# Patient Record
Sex: Female | Born: 1975
Health system: Southern US, Community
[De-identification: ages and names within clinical notes are randomized; demographics above are authoritative.]

## PROBLEM LIST (undated history)

## (undated) DIAGNOSIS — E079 Disorder of thyroid, unspecified: Secondary | ICD-10-CM

## (undated) HISTORY — PX: KNEE ARTHROPLASTY: SHX992

## (undated) HISTORY — PX: EAR TUBE REMOVAL: SHX1486

---

## 2002-01-20 ENCOUNTER — Other Ambulatory Visit: Admission: RE | Admit: 2002-01-20 | Discharge: 2002-01-20 | Payer: Self-pay | Admitting: Obstetrics & Gynecology

## 2004-01-24 ENCOUNTER — Ambulatory Visit (HOSPITAL_COMMUNITY): Admission: RE | Admit: 2004-01-24 | Discharge: 2004-01-24 | Payer: Self-pay | Admitting: Family Medicine

## 2004-10-06 ENCOUNTER — Inpatient Hospital Stay (HOSPITAL_COMMUNITY): Admission: RE | Admit: 2004-10-06 | Discharge: 2004-10-09 | Payer: Self-pay | Admitting: Obstetrics & Gynecology

## 2005-10-04 ENCOUNTER — Encounter: Admission: RE | Admit: 2005-10-04 | Discharge: 2005-10-04 | Payer: Self-pay | Admitting: Occupational Medicine

## 2009-01-17 ENCOUNTER — Emergency Department (HOSPITAL_COMMUNITY): Admission: EM | Admit: 2009-01-17 | Discharge: 2009-01-17 | Payer: Self-pay | Admitting: Emergency Medicine

## 2009-10-26 ENCOUNTER — Other Ambulatory Visit: Admission: RE | Admit: 2009-10-26 | Discharge: 2009-10-26 | Payer: Self-pay | Admitting: Obstetrics and Gynecology

## 2009-11-15 ENCOUNTER — Ambulatory Visit (HOSPITAL_COMMUNITY): Admission: RE | Admit: 2009-11-15 | Discharge: 2009-11-15 | Payer: Self-pay | Admitting: Obstetrics & Gynecology

## 2010-03-13 ENCOUNTER — Ambulatory Visit (HOSPITAL_COMMUNITY)
Admission: RE | Admit: 2010-03-13 | Discharge: 2010-03-13 | Payer: Self-pay | Source: Home / Self Care | Admitting: Obstetrics & Gynecology

## 2010-05-11 LAB — CBC
HCT: 26.7 % — ABNORMAL LOW (ref 36.0–46.0)
Hemoglobin: 8.4 g/dL — ABNORMAL LOW (ref 12.0–15.0)
MCH: 23 pg — ABNORMAL LOW (ref 26.0–34.0)
MCHC: 31.5 g/dL (ref 30.0–36.0)
MCV: 73 fL — ABNORMAL LOW (ref 78.0–100.0)
Platelets: 172 10*3/uL (ref 150–400)
RBC: 3.66 MIL/uL — ABNORMAL LOW (ref 3.87–5.11)
RDW: 17.9 % — ABNORMAL HIGH (ref 11.5–15.5)
WBC: 11 10*3/uL — ABNORMAL HIGH (ref 4.0–10.5)

## 2010-05-11 LAB — TSH: TSH: 4.051 u[IU]/mL (ref 0.350–4.500)

## 2010-05-11 LAB — T3, FREE: T3, Free: 2.5 pg/mL (ref 2.3–4.2)

## 2010-05-11 LAB — RPR: RPR Ser Ql: NONREACTIVE

## 2010-05-11 LAB — T4, FREE: Free T4: 0.76 ng/dL — ABNORMAL LOW (ref 0.80–1.80)

## 2010-05-11 LAB — SURGICAL PCR SCREEN
MRSA, PCR: NEGATIVE
Staphylococcus aureus: NEGATIVE

## 2010-05-18 ENCOUNTER — Inpatient Hospital Stay (HOSPITAL_COMMUNITY)
Admission: RE | Admit: 2010-05-18 | Discharge: 2010-05-20 | DRG: 766 | Disposition: A | Discharge status: Home / Self Care | Payer: 59 | Source: Ambulatory Visit | Attending: Obstetrics & Gynecology | Admitting: Obstetrics & Gynecology

## 2010-05-18 DIAGNOSIS — O9902 Anemia complicating childbirth: Secondary | ICD-10-CM | POA: Diagnosis present

## 2010-05-18 DIAGNOSIS — O34219 Maternal care for unspecified type scar from previous cesarean delivery: Principal | ICD-10-CM | POA: Diagnosis present

## 2010-05-18 DIAGNOSIS — E039 Hypothyroidism, unspecified: Secondary | ICD-10-CM | POA: Diagnosis present

## 2010-05-18 DIAGNOSIS — O09529 Supervision of elderly multigravida, unspecified trimester: Secondary | ICD-10-CM | POA: Diagnosis present

## 2010-05-18 DIAGNOSIS — D509 Iron deficiency anemia, unspecified: Secondary | ICD-10-CM | POA: Diagnosis present

## 2010-05-18 DIAGNOSIS — E079 Disorder of thyroid, unspecified: Secondary | ICD-10-CM | POA: Diagnosis present

## 2010-05-18 LAB — ABO/RH: ABO/RH(D): O POS

## 2010-05-19 LAB — CBC
HCT: 22.6 % — ABNORMAL LOW (ref 36.0–46.0)
Hemoglobin: 6.9 g/dL — CL (ref 12.0–15.0)
MCH: 22.9 pg — ABNORMAL LOW (ref 26.0–34.0)
MCHC: 30.5 g/dL (ref 30.0–36.0)
MCV: 75.1 fL — ABNORMAL LOW (ref 78.0–100.0)
Platelets: 145 10*3/uL — ABNORMAL LOW (ref 150–400)
RBC: 3.01 MIL/uL — ABNORMAL LOW (ref 3.87–5.11)
RDW: 17.6 % — ABNORMAL HIGH (ref 11.5–15.5)
WBC: 10.6 10*3/uL — ABNORMAL HIGH (ref 4.0–10.5)

## 2010-05-20 LAB — CBC
HCT: 22.7 % — ABNORMAL LOW (ref 36.0–46.0)
Hemoglobin: 7 g/dL — ABNORMAL LOW (ref 12.0–15.0)
MCH: 22.7 pg — ABNORMAL LOW (ref 26.0–34.0)
MCHC: 30 g/dL (ref 30.0–36.0)
MCV: 75.9 fL — ABNORMAL LOW (ref 78.0–100.0)
Platelets: 160 10*3/uL (ref 150–400)
RBC: 2.99 MIL/uL — ABNORMAL LOW (ref 3.87–5.11)
RDW: 17.9 % — ABNORMAL HIGH (ref 11.5–15.5)
WBC: 9.8 10*3/uL (ref 4.0–10.5)

## 2010-05-21 LAB — TYPE AND SCREEN
ABO/RH(D): O POS
Antibody Screen: NEGATIVE
Unit division: 0
Unit division: 0

## 2010-05-21 LAB — FERRITIN: Ferritin: 12 ng/mL (ref 10–291)

## 2010-05-24 LAB — HEMOGLOBINOPATHY EVALUATION
Hemoglobin Other: 0 % (ref 0.0–0.0)
Hgb A2 Quant: 2.2 % (ref 2.2–3.2)
Hgb A: 97.8 % (ref 96.8–97.8)
Hgb F Quant: 0 % (ref 0.0–2.0)
Hgb S Quant: 0 % (ref 0.0–0.0)

## 2010-05-27 ENCOUNTER — Inpatient Hospital Stay (HOSPITAL_COMMUNITY): Admission: AD | Admit: 2010-05-27 | Payer: Self-pay | Source: Ambulatory Visit | Admitting: Obstetrics & Gynecology

## 2010-06-28 NOTE — Discharge Summary (Signed)
Debra Hess, Debra Hess                ACCOUNT NO.:  [id number]  MEDICAL RECORD NO.:  [id number]           PATIENT TYPE:  I  LOCATION:  9148                          [facility]ACILITY[facility]  [facility]  PHYSICIAN:  Debra Hess, M.D.DATE OF BIRTH:  1975-12-25  DATE OF ADMISSION:  05/18/2010 DATE OF DISCHARGE:  05/20/2010                              DISCHARGE SUMMARY   ADMITTING DIAGNOSES:  A 39 weeks' gestation, previous cesarean section, desires repeat, and chronic anemia.  DISCHARGE DIAGNOSES:  Postoperative day #2, status post repeat cesarean section, and chronic anemia.  HISTORY:  The patient is a 35 year old gravida 3, para 2, at 19 weeks' gestation with an [facility] of May 25, 2010.  Prenatal care has been at [facility] since 15 weeks' gestation with Dr. Seymour Bars as primary. Prenatal labs include the patient is O+, rubella positive, HIV negative, RPR negative, hepatitis B negative, and a GTT within normal limits. Prenatal course was complicated by history of previous C-section x2, advanced maternal age, anemia, and hypothyroidism.  Medical surgery history is consistent for anemia, hypothyroidism, and cesarean section x2.  ALLERGIES:  AMOXICILLIN and MORPHINE.  CURRENT MEDICATIONS:  Prenatal vitamin 1 tablet daily, Niferex 150 mg p.o. daily, Tylenol 650 mg q.4 h. p.r.n. discomfort, Colace 100 mg twice daily for prevention of constipation, and Ambien 10 mg p.o. at bedtime p.r.n. for sleep.  PRESENTATION:  The patient presents for scheduled cesarean section on the day of admission.  Admission CBC:  White blood cell count of 11, hemoglobin 8.4, hematocrit 26.7, and platelet count of 172.  The patient undergoes cesarean section by Dr. Seymour Bars on May 18, 2010, with delivery of a female, 9 pounds 15 ounces with Apgar scores of 9 and 9. Newborn was transferred to the regular nursery.  POSTPARTUM CARE:  Postoperative care is uneventful.  Postoperative CBC: White blood cell count 10.6,  hemoglobin 6.9, hematocrit 22.6, and platelet count of 145 on postoperative day #1.  On postoperative day #2, repeat CBC reveals a hemoglobin stable at 7.0, hematocrit 22.7, and a platelet count of 160.  Vital signs are within normal limits.  The patient remains afebrile during the hospitalization.  Physical exam is within normal limits.  Wound edges are well approximated with staples. No erythema, no ecchymosis, or drainage.  The patient is discharged home in stable condition on postoperative day #2.  DISCHARGE INSTRUCTIONS:  Postpartum instructions per [facility] booklet.  Activity restrictions x2 weeks postoperatively.  The patient is on a regular diet.  The patient to return to [facility] on May 24, 2010, for removal of staples.  Medications at the time of discharge include prenatal vitamins 1 tablet p.o. daily, Niferex 150 mg p.o. daily x3 months, Colace 1 to 2 caps daily as needed for constipation prevention p.r.n., ibuprofen 800 mg every 8 hours p.r.n. discomfort, Percocet 1-2 tablets every 4 hours p.r.n. for pain.  Routine followup in 6 weeks postpartum.     Debra Hess, C.N.M.   ______________________________ Debra Hess, M.D.    TB/MEDQ  D:  05/20/2010  T:  05/21/2010  Job:  [id number]  Electronically Signed by Debra Hess C.N.M. on  06/01/2010 11:25:55 AM Electronically Signed by Debra Hess M.D. on 06/28/2010 06:22:20 PM

## 2010-06-28 NOTE — Op Note (Signed)
Debra Hess, Debra Hess                ACCOUNT NO.:  [id number]  MEDICAL RECORD NO.:  [id number]           PATIENT TYPE:  I  LOCATION:  9148                          [facility]  PHYSICIAN:  Genia Del, M.D.DATE OF BIRTH:  [birth year]  DATE OF PROCEDURE:  05/18/2010 DATE OF DISCHARGE:                              OPERATIVE REPORT   [facility] for a C- section.  PREOPERATIVE DIAGNOSES:  A 39+ weeks' gestation, two previous cesarean sections.  POSTOPERATIVE DIAGNOSES:  A 39+ weeks' gestation, two previous cesarean sections, adhesions between omentum, bladder, and uterus.  PROCEDURE:  Repeat low-transverse C-section and lysis of adhesions.  PROCEDURE:  Under spinal anesthesia in 15-degree left decubitus position, the patient is prepped with SurgiPrep on the abdomen and suprapubic areas and with Betadine on the vulvar and vaginal areas.  The bladder is catheterized and a Foley is left in place.  We draped the patient as usual.  We verify the level of anesthesia, which is adequate. We do a local anesthesia with Marcaine 0.25 plain 10 mL at the level of the previous Pfannenstiel.  We make a Pfannenstiel with a scalpel at the previous incision.  We opened the adipose tissue with the scalpel and then with the electrocautery in cutting mode.  We control hemostasis with the electrocautery in coag mode.  We opened the aponeurosis transversely with the electrocautery and then with Mayo scissors.  We then separate the recti muscles from the aponeurosis superiorly and inferiorly.  We note that on the right side, no muscle fibroids are present.  We then open the parietal peritoneum with [facility] scissors. There we have adhesions between the omentum and the abdominal wall as well as with the uterus and the bladder.  We are able to release the omentum sufficiently to access the lower uterine segment safely.  We lower the bladder downward  and put the bladder retractor in place.  The lower uterine segment is pretty thin.  We make a low transverse incision with the scalpel over the lower uterine segment.  We extend on each side with dressing scissors.  The amniotic fluid is clear.  The fetus is in cephalic presentation.  Birth of the baby boy at 8:08 a.m.  The cord is clamped and cut.  The baby was suctioned and given to the neonatal team. Apgars are 9 and 9.  The weight is 9 pounds 15 ounces.  We take the cord blood.  We remove the placenta spontaneously and do an uterine revision. We use fenestrated clamp on the hysterotomy to control bleeding and help with closure.  It is not possible to exteriorize the uterus.  Both tubes and both ovaries are normal to inspection.  We close the hysterotomy with a first full plain locked running suture of Vicryl 0.  We close then with a second plane in a mattress stitch of Vicryl 0.  Hemostasis is adequate at all levels.  We irrigate and suction the abdominopelvic cavity.  We then close the aponeurosis with two half running sutures of Vicryl 0.  We then do  a running suture of plain to reapproximate the adipose tissue.  We then reapproximated the skin with staples and apply a compressive dressing.  Note that the patient received Flagyl 500 mg IV before the beginning of the intervention.  The count of instruments and sponges was complete.  The estimated blood loss was 600 mL.  No complications occurred and the patient was brought to recovery room in good stable status.     Genia Del, M.D.     ML/MEDQ  D:  05/18/2010  T:  05/19/2010  Job:  [id number]  Electronically Signed by Genia Del M.D. on 06/28/2010 06:22:17 PM

## 2010-07-20 LAB — DIFFERENTIAL
Basophils Absolute: 0.1 10*3/uL (ref 0.0–0.1)
Basophils Relative: 1 % (ref 0–1)
Eosinophils Absolute: 0.1 10*3/uL (ref 0.0–0.7)
Eosinophils Relative: 1 % (ref 0–5)
Lymphocytes Relative: 27 % (ref 12–46)
Lymphs Abs: 2.3 10*3/uL (ref 0.7–4.0)
Monocytes Absolute: 0.7 10*3/uL (ref 0.1–1.0)
Monocytes Relative: 8 % (ref 3–12)
Neutro Abs: 5.4 10*3/uL (ref 1.7–7.7)
Neutrophils Relative %: 63 % (ref 43–77)

## 2010-07-20 LAB — COMPREHENSIVE METABOLIC PANEL
ALT: 13 U/L (ref 0–35)
AST: 17 U/L (ref 0–37)
Albumin: 4.1 g/dL (ref 3.5–5.2)
Alkaline Phosphatase: 61 U/L (ref 39–117)
BUN: 12 mg/dL (ref 6–23)
CO2: 25 mEq/L (ref 19–32)
Calcium: 9 mg/dL (ref 8.4–10.5)
Chloride: 106 mEq/L (ref 96–112)
Creatinine, Ser: 0.83 mg/dL (ref 0.4–1.2)
GFR calc Af Amer: >60 mL/min (ref >60)
GFR calc non Af Amer: >60 mL/min (ref >60)
Glucose, Bld: 104 mg/dL — ABNORMAL HIGH (ref 70–99)
Potassium: 3.8 mEq/L (ref 3.5–5.1)
Sodium: 137 mEq/L (ref 135–145)
Total Bilirubin: 0.5 mg/dL (ref 0.3–1.2)
Total Protein: 7 g/dL (ref 6.0–8.3)

## 2010-07-20 LAB — POCT CARDIAC MARKERS
CKMB, poc: <1 ng/mL — ABNORMAL LOW (ref 1.0–8.0)
Myoglobin, poc: 37.6 ng/mL (ref 12–200)
Troponin i, poc: <0.05 ng/mL (ref 0.00–0.09)

## 2010-07-20 LAB — D-DIMER, QUANTITATIVE: D-Dimer, Quant: 0.26 ug/mL-FEU (ref 0.00–0.48)

## 2010-07-20 LAB — CBC
HCT: 33.2 % — ABNORMAL LOW (ref 36.0–46.0)
Hemoglobin: 11.4 g/dL — ABNORMAL LOW (ref 12.0–15.0)
MCHC: 34.3 g/dL (ref 30.0–36.0)
MCV: 83.7 fL (ref 78.0–100.0)
Platelets: 214 10*3/uL (ref 150–400)
RBC: 3.96 MIL/uL (ref 3.87–5.11)
RDW: 13.2 % (ref 11.5–15.5)
WBC: 8.6 10*3/uL (ref 4.0–10.5)

## 2010-09-01 NOTE — Discharge Summary (Signed)
Debra Hess, Debra Hess                ACCOUNT NO.:  [id number]   MEDICAL RECORD NO.:  [id number]          PATIENT TYPE:  INP   LOCATION:  9120                          [facility]   PHYSICIAN:  Genia Del, M.D.DATE OF BIRTH:  1975/06/30   DATE OF ADMISSION:  10/06/2004  DATE OF DISCHARGE:  10/09/2004                                 DISCHARGE SUMMARY   ADMISSION DIAGNOSIS:  1.  Thirty-nine weeks.  2.  History of previous dissection.   DISCHARGE DIAGNOSES:  1.  Thirty-nine weeks.  2.  History of previous dissection.   INTERVENTION:  Repeat low transverse cesarean section.   HOSPITAL COURSE:  The patient decided to have a repeat low transverse  cesarean section.  She had a baby girl.  Apgars were 8 and 9.  The surgery  went without any complications.  Her postoperative evaluation was normal.  She was discharged in stable status on postoperative day #3.  Postoperative  advice given.  She was prescribed Tylox p.r.n.  Her hemoglobin postoperative  was 9.6.  Chromagen  Forte daily was also prescribed.  She will follow up  [facility] in four weeks.      Genia Del, M.D.  Electronically Signed     ML/MEDQ  D:  11/22/2004  T:  11/23/2004  Job:  [id number]

## 2010-09-01 NOTE — Op Note (Signed)
Debra Hess, Debra Hess                ACCOUNT NO.:  [id number]   MEDICAL RECORD NO.:  [id number]          PATIENT TYPE:  INP   LOCATION:  9120                          [facility]   PHYSICIAN:  Genia Del, M.D.DATE OF BIRTH:  1975/06/14   DATE OF PROCEDURE:  10/06/2004  DATE OF DISCHARGE:                                 OPERATIVE REPORT   PREOPERATIVE DIAGNOSIS:  1. Thirty-nine-plus weeks gestation.  2. History of cesarean section.   POSTOPERATIVE DIAGNOSES:  1. Thirty-nine-plus weeks gestation.  2. History of cesarean section.   OPERATION/PROCEDURE:  Repeat low transverse cesarean section.   SURGEON:  Genia Del, M.D.   ANESTHESIOLOGIST:  Burnett Corrente, M.D.   DESCRIPTION OF PROCEDURE:  Under spinal anesthesia, the patient is in 15-  degree left decubitus position.  She was prepped with Techni-Care on the  abdomen, suprapubic, vulva, and vaginal areas and draped as usual.  The  level of anesthesia is verified and adequate.  We used to scalpel to resect  the scar from her previous cesarean section.  We then opened the aponeurosis  transversely with the electrocautery and the Mayo scissors.  We opened the  parietal peritoneum longitudinally with Metzenbaum scissors.  We then opened  the visceroperitoneum over the lower uterine segment transversely with  Metzenbaum scissors.  The bladder is reclined downward.  We make a low  transverse hysterotomy with the scalpel.  Extension on each side is done  with dressing scissors.  The amniotic fluid is clear.  The fetus is in  cephalic position. Birth of a baby girl at 11.  The cord is clamped and  cut.  The baby is suctioned and given to neonatal team.  Apgars are 8 an day  #9.  The cord blood was taken and evacuated spontaneously.  It is complete  with three vessels.  Revision of the intrauterine cavity is done.  The  Pitocin is started in the IV fluid.  A dose of Flagyl IV is given.  The  uterus contracts well.  We  close the hysterotomy in one locked running  suture of 0 Vicryl.  We make a second layer with a mattress stitch of 0  Vicryl.  Hemostasis is adequate at all levels.  Both ovaries and both tubes  are normal in appearance as well as the uterus.  We then verify hemostasis  on the bladder flap,  the rectus muscles and the aponeurosis as well as the  adipose tissue.  It is completed with the electrocautery where necessary.  The rectus muscles are very much separated in the midline.  We used 2-0  Vicryl to reapproximate them as much as possible on the midline.  We then  closed the aponeurosis with two half running sutures of 0 Vicryl.  We  infiltrate the subcutaneous tissue with Marcaine 0.25% plain and  reapproximate the skin with staples.  A dry dressing is applied.  The  estimated blood loss is 600 mm.  No complications occurred and the patient  was brought to the recovery room in good status.       ML/MEDQ  D:  10/06/2004  T:  10/06/2004  Job:  [id number]

## 2015-05-06 ENCOUNTER — Encounter (HOSPITAL_COMMUNITY): Admission: RE | Admit: 2015-05-06 | Payer: Self-pay | Source: Ambulatory Visit

## 2015-05-13 ENCOUNTER — Inpatient Hospital Stay (HOSPITAL_COMMUNITY): Admission: RE | Admit: 2015-05-13 | Payer: Self-pay | Source: Ambulatory Visit

## 2015-06-30 DIAGNOSIS — H5202 Hypermetropia, left eye: Secondary | ICD-10-CM | POA: Diagnosis not present

## 2015-06-30 DIAGNOSIS — H52223 Regular astigmatism, bilateral: Secondary | ICD-10-CM | POA: Diagnosis not present

## 2015-06-30 DIAGNOSIS — H5211 Myopia, right eye: Secondary | ICD-10-CM | POA: Diagnosis not present

## 2015-07-05 ENCOUNTER — Other Ambulatory Visit (HOSPITAL_COMMUNITY): Payer: Self-pay | Admitting: Family Medicine

## 2015-07-05 DIAGNOSIS — Z9289 Personal history of other medical treatment: Secondary | ICD-10-CM

## 2015-07-15 ENCOUNTER — Other Ambulatory Visit (HOSPITAL_COMMUNITY): Payer: Self-pay | Admitting: Family Medicine

## 2015-07-15 ENCOUNTER — Ambulatory Visit (HOSPITAL_COMMUNITY)
Admission: RE | Admit: 2015-07-15 | Discharge: 2015-07-15 | Disposition: A | Discharge status: Home / Self Care | Payer: 59 | Source: Ambulatory Visit | Attending: Family Medicine | Admitting: Family Medicine

## 2015-07-15 DIAGNOSIS — Z1231 Encounter for screening mammogram for malignant neoplasm of breast: Secondary | ICD-10-CM | POA: Diagnosis not present

## 2015-07-15 DIAGNOSIS — R928 Other abnormal and inconclusive findings on diagnostic imaging of breast: Secondary | ICD-10-CM | POA: Diagnosis not present

## 2015-07-19 ENCOUNTER — Other Ambulatory Visit (HOSPITAL_COMMUNITY): Payer: Self-pay | Admitting: Family Medicine

## 2015-07-19 DIAGNOSIS — R928 Other abnormal and inconclusive findings on diagnostic imaging of breast: Secondary | ICD-10-CM

## 2015-07-26 ENCOUNTER — Ambulatory Visit (HOSPITAL_COMMUNITY)
Admission: RE | Admit: 2015-07-26 | Discharge: 2015-07-26 | Disposition: A | Discharge status: Home / Self Care | Payer: 59 | Source: Ambulatory Visit | Attending: Family Medicine | Admitting: Family Medicine

## 2015-07-26 DIAGNOSIS — N6001 Solitary cyst of right breast: Secondary | ICD-10-CM | POA: Diagnosis not present

## 2015-07-26 DIAGNOSIS — N6012 Diffuse cystic mastopathy of left breast: Secondary | ICD-10-CM | POA: Diagnosis not present

## 2015-07-26 DIAGNOSIS — R928 Other abnormal and inconclusive findings on diagnostic imaging of breast: Secondary | ICD-10-CM

## 2015-07-26 DIAGNOSIS — N63 Unspecified lump in breast: Secondary | ICD-10-CM | POA: Diagnosis not present

## 2016-06-12 ENCOUNTER — Other Ambulatory Visit: Payer: Self-pay

## 2016-06-12 ENCOUNTER — Other Ambulatory Visit (HOSPITAL_COMMUNITY): Payer: Self-pay

## 2016-11-05 DIAGNOSIS — E559 Vitamin D deficiency, unspecified: Secondary | ICD-10-CM | POA: Diagnosis not present

## 2016-11-05 DIAGNOSIS — D649 Anemia, unspecified: Secondary | ICD-10-CM | POA: Diagnosis not present

## 2016-11-05 DIAGNOSIS — F419 Anxiety disorder, unspecified: Secondary | ICD-10-CM | POA: Diagnosis not present

## 2016-11-05 DIAGNOSIS — E782 Mixed hyperlipidemia: Secondary | ICD-10-CM | POA: Diagnosis not present

## 2016-11-05 DIAGNOSIS — R946 Abnormal results of thyroid function studies: Secondary | ICD-10-CM | POA: Diagnosis not present

## 2016-11-05 DIAGNOSIS — Z6833 Body mass index (BMI) 33.0-33.9, adult: Secondary | ICD-10-CM | POA: Diagnosis not present

## 2016-11-05 DIAGNOSIS — Z1389 Encounter for screening for other disorder: Secondary | ICD-10-CM | POA: Diagnosis not present

## 2016-11-05 MED FILL — ESCITALOPRAM 10 MG TABLET: 10 | 90 days supply | Qty: 90 | Fill #0

## 2016-11-06 MED FILL — VIT D2 1.25 MG (50,000 UNIT: 1.25 MG | 84 days supply | Qty: 12 | Fill #0

## 2016-11-06 MED FILL — LEVOTHYROXINE 50 MCG TABLET: 50 | 30 days supply | Qty: 30 | Fill #0

## 2016-11-06 MED FILL — PHENTERMINE 37.5 MG TABLET: 37.5 | 30 days supply | Qty: 30 | Fill #0

## 2016-11-09 MED FILL — ONDANSETRON ODT 4 MG TABLET: 4 | 10 days supply | Qty: 30 | Fill #0

## 2016-12-03 MED FILL — LEVOTHYROXINE 50 MCG TABLET: 50 | 30 days supply | Qty: 30 | Fill #1

## 2016-12-04 MED FILL — PHENTERMINE 37.5 MG TABLET: 37.5 | 30 days supply | Qty: 30 | Fill #1

## 2016-12-19 DIAGNOSIS — E039 Hypothyroidism, unspecified: Secondary | ICD-10-CM | POA: Diagnosis not present

## 2016-12-19 DIAGNOSIS — E559 Vitamin D deficiency, unspecified: Secondary | ICD-10-CM | POA: Diagnosis not present

## 2016-12-19 DIAGNOSIS — D509 Iron deficiency anemia, unspecified: Secondary | ICD-10-CM | POA: Diagnosis not present

## 2016-12-19 DIAGNOSIS — Z6831 Body mass index (BMI) 31.0-31.9, adult: Secondary | ICD-10-CM | POA: Diagnosis not present

## 2016-12-19 DIAGNOSIS — Z1389 Encounter for screening for other disorder: Secondary | ICD-10-CM | POA: Diagnosis not present

## 2017-01-07 MED FILL — LEVOTHYROXINE 50 MCG TABLET: 50 | 30 days supply | Qty: 30 | Fill #2

## 2017-02-13 MED FILL — LEVOTHYROXINE 50 MCG TABLET: 50 | 30 days supply | Qty: 30 | Fill #3

## 2017-03-01 MED FILL — ESCITALOPRAM 10 MG TABLET: 10 | 90 days supply | Qty: 90 | Fill #1

## 2017-05-01 MED FILL — PHENTERMINE 37.5 MG TABLET: 37.5 | 30 days supply | Qty: 30 | Fill #2

## 2017-05-01 MED FILL — ONDANSETRON ODT 4 MG TABLET: 4 | 10 days supply | Qty: 30 | Fill #1

## 2017-05-01 MED FILL — LEVOTHYROXINE 50 MCG TABLET: 50 | 60 days supply | Qty: 60 | Fill #4

## 2017-05-20 DIAGNOSIS — Z1389 Encounter for screening for other disorder: Secondary | ICD-10-CM | POA: Diagnosis not present

## 2017-05-20 DIAGNOSIS — E039 Hypothyroidism, unspecified: Secondary | ICD-10-CM | POA: Diagnosis not present

## 2017-05-20 DIAGNOSIS — R7309 Other abnormal glucose: Secondary | ICD-10-CM | POA: Diagnosis not present

## 2017-05-20 DIAGNOSIS — Z6830 Body mass index (BMI) 30.0-30.9, adult: Secondary | ICD-10-CM | POA: Diagnosis not present

## 2017-05-20 DIAGNOSIS — E559 Vitamin D deficiency, unspecified: Secondary | ICD-10-CM | POA: Diagnosis not present

## 2017-05-22 MED FILL — VIT D2 1.25 MG (50,000 UNIT: 1.25 MG | 84 days supply | Qty: 12 | Fill #0

## 2017-05-24 MED FILL — LEVOTHYROXINE 75 MCG TABLET: 75 | 90 days supply | Qty: 90 | Fill #0

## 2017-06-21 MED FILL — ESCITALOPRAM 10 MG TABLET: 10 | 90 days supply | Qty: 90 | Fill #0

## 2017-07-05 MED FILL — ONDANSETRON ODT 4 MG TABLET: 4 | 10 days supply | Qty: 30 | Fill #2

## 2017-07-25 DIAGNOSIS — M5412 Radiculopathy, cervical region: Secondary | ICD-10-CM | POA: Diagnosis not present

## 2017-07-25 DIAGNOSIS — M25511 Pain in right shoulder: Secondary | ICD-10-CM | POA: Diagnosis not present

## 2017-09-26 DIAGNOSIS — N920 Excessive and frequent menstruation with regular cycle: Secondary | ICD-10-CM | POA: Diagnosis not present

## 2017-09-26 DIAGNOSIS — Z6830 Body mass index (BMI) 30.0-30.9, adult: Secondary | ICD-10-CM | POA: Diagnosis not present

## 2017-09-26 DIAGNOSIS — Z3009 Encounter for other general counseling and advice on contraception: Secondary | ICD-10-CM | POA: Diagnosis not present

## 2017-09-26 DIAGNOSIS — Z1151 Encounter for screening for human papillomavirus (HPV): Secondary | ICD-10-CM | POA: Diagnosis not present

## 2017-09-27 MED FILL — FERRAPLUS 90 TABLET: 90-1 | 30 days supply | Qty: 30 | Fill #0

## 2017-10-07 MED FILL — ESCITALOPRAM 10 MG TABLET: 10 | 90 days supply | Qty: 90 | Fill #1

## 2017-10-08 DIAGNOSIS — H5211 Myopia, right eye: Secondary | ICD-10-CM | POA: Diagnosis not present

## 2017-10-08 DIAGNOSIS — H52223 Regular astigmatism, bilateral: Secondary | ICD-10-CM | POA: Diagnosis not present

## 2017-10-08 DIAGNOSIS — H5202 Hypermetropia, left eye: Secondary | ICD-10-CM | POA: Diagnosis not present

## 2017-10-08 MED FILL — AZITHROMYCIN 250 MG TABLET: 250 | 5 days supply | Qty: 6 | Fill #0

## 2017-10-09 DIAGNOSIS — N920 Excessive and frequent menstruation with regular cycle: Secondary | ICD-10-CM | POA: Diagnosis not present

## 2017-10-15 MED FILL — FLUCONAZOLE 150 MG TABS: 150 | 1 days supply | Qty: 1 | Fill #0

## 2017-10-16 DIAGNOSIS — N882 Stricture and stenosis of cervix uteri: Secondary | ICD-10-CM | POA: Diagnosis not present

## 2017-10-16 DIAGNOSIS — Z3202 Encounter for pregnancy test, result negative: Secondary | ICD-10-CM | POA: Diagnosis not present

## 2017-10-16 DIAGNOSIS — Z30430 Encounter for insertion of intrauterine contraceptive device: Secondary | ICD-10-CM | POA: Diagnosis not present

## 2017-11-14 DIAGNOSIS — Z30431 Encounter for routine checking of intrauterine contraceptive device: Secondary | ICD-10-CM | POA: Diagnosis not present

## 2018-01-07 MED FILL — ESCITALOPRAM 10 MG TABLET: 10 | 90 days supply | Qty: 90 | Fill #0

## 2018-01-07 MED FILL — FERRAPLUS 90 TABLET: 90-1 | 30 days supply | Qty: 30 | Fill #1

## 2018-01-08 MED FILL — PHENTERMINE 37.5 MG TABLET: 37.5 | 30 days supply | Qty: 30 | Fill #0

## 2018-04-21 MED FILL — LEVOTHYROXINE 50 MCG TABLET: 50 | 90 days supply | Qty: 90 | Fill #0

## 2018-04-21 MED FILL — ESCITALOPRAM 10 MG TABLET: 10 | 90 days supply | Qty: 90 | Fill #1

## 2018-06-25 DIAGNOSIS — E559 Vitamin D deficiency, unspecified: Secondary | ICD-10-CM | POA: Diagnosis not present

## 2018-06-25 DIAGNOSIS — R7309 Other abnormal glucose: Secondary | ICD-10-CM | POA: Diagnosis not present

## 2018-06-25 DIAGNOSIS — Z0001 Encounter for general adult medical examination with abnormal findings: Secondary | ICD-10-CM | POA: Diagnosis not present

## 2018-06-25 DIAGNOSIS — Z6833 Body mass index (BMI) 33.0-33.9, adult: Secondary | ICD-10-CM | POA: Diagnosis not present

## 2018-06-25 DIAGNOSIS — D509 Iron deficiency anemia, unspecified: Secondary | ICD-10-CM | POA: Diagnosis not present

## 2018-06-25 DIAGNOSIS — E039 Hypothyroidism, unspecified: Secondary | ICD-10-CM | POA: Diagnosis not present

## 2018-06-25 DIAGNOSIS — F419 Anxiety disorder, unspecified: Secondary | ICD-10-CM | POA: Diagnosis not present

## 2018-06-25 DIAGNOSIS — Z1389 Encounter for screening for other disorder: Secondary | ICD-10-CM | POA: Diagnosis not present

## 2018-06-27 MED FILL — LEVOTHYROXINE 75 MCG TABLET: 75 | 90 days supply | Qty: 90 | Fill #0

## 2018-06-27 MED FILL — VIT D2 1.25 MG (50,000 UNIT: 1.25 MG | 84 days supply | Qty: 12 | Fill #0

## 2018-10-14 MED FILL — ESCITALOPRAM 20 MG TABLET: 20 | 30 days supply | Qty: 30 | Fill #0

## 2018-11-06 ENCOUNTER — Other Ambulatory Visit (HOSPITAL_COMMUNITY): Payer: Self-pay | Admitting: Family Medicine

## 2018-11-06 DIAGNOSIS — Z1231 Encounter for screening mammogram for malignant neoplasm of breast: Secondary | ICD-10-CM

## 2018-11-26 MED FILL — ESCITALOPRAM 20 MG TABLET: 20 | 30 days supply | Qty: 30 | Fill #1

## 2019-01-01 MED FILL — ESCITALOPRAM 20 MG TABLET: 20 | 30 days supply | Qty: 30 | Fill #2

## 2019-02-11 ENCOUNTER — Other Ambulatory Visit: Payer: Self-pay

## 2019-02-11 DIAGNOSIS — Z20822 Contact with and (suspected) exposure to covid-19: Secondary | ICD-10-CM

## 2019-02-12 LAB — NOVEL CORONAVIRUS, NAA: SARS-CoV-2, NAA: NOT DETECTED

## 2019-02-18 MED FILL — ESCITALOPRAM 20 MG TABLET: 20 | 30 days supply | Qty: 30 | Fill #3

## 2019-04-21 MED FILL — ESCITALOPRAM 20 MG TABLET: 20 | 30 days supply | Qty: 30 | Fill #4

## 2019-05-07 ENCOUNTER — Other Ambulatory Visit: Payer: 59

## 2019-05-18 MED FILL — ESCITALOPRAM 20 MG TABLET: 20 | 30 days supply | Qty: 30 | Fill #5

## 2019-08-02 DIAGNOSIS — Z03818 Encounter for observation for suspected exposure to other biological agents ruled out: Secondary | ICD-10-CM | POA: Diagnosis not present

## 2019-08-02 DIAGNOSIS — Z20828 Contact with and (suspected) exposure to other viral communicable diseases: Secondary | ICD-10-CM | POA: Diagnosis not present

## 2019-08-21 DIAGNOSIS — E039 Hypothyroidism, unspecified: Secondary | ICD-10-CM | POA: Diagnosis not present

## 2019-08-21 DIAGNOSIS — F419 Anxiety disorder, unspecified: Secondary | ICD-10-CM | POA: Diagnosis not present

## 2019-08-21 MED FILL — LEVOTHYROXINE 88 MCG TABLET: 88 | 90 days supply | Qty: 90 | Fill #0

## 2019-08-21 MED FILL — rOPINIRole HCL 0.25 MG TABS: 0.25 | 45 days supply | Qty: 90 | Fill #0

## 2019-08-31 MED FILL — ESCITALOPRAM 20 MG TABLET: 20 | 30 days supply | Qty: 30 | Fill #0

## 2019-09-16 DIAGNOSIS — Z118 Encounter for screening for other infectious and parasitic diseases: Secondary | ICD-10-CM | POA: Diagnosis not present

## 2019-09-16 DIAGNOSIS — Z6837 Body mass index (BMI) 37.0-37.9, adult: Secondary | ICD-10-CM | POA: Diagnosis not present

## 2019-09-16 DIAGNOSIS — Z01419 Encounter for gynecological examination (general) (routine) without abnormal findings: Secondary | ICD-10-CM | POA: Diagnosis not present

## 2019-09-16 DIAGNOSIS — Z1159 Encounter for screening for other viral diseases: Secondary | ICD-10-CM | POA: Diagnosis not present

## 2019-09-16 DIAGNOSIS — Z1231 Encounter for screening mammogram for malignant neoplasm of breast: Secondary | ICD-10-CM | POA: Diagnosis not present

## 2019-09-16 DIAGNOSIS — Z113 Encounter for screening for infections with a predominantly sexual mode of transmission: Secondary | ICD-10-CM | POA: Diagnosis not present

## 2019-09-16 DIAGNOSIS — Z114 Encounter for screening for human immunodeficiency virus [HIV]: Secondary | ICD-10-CM | POA: Diagnosis not present

## 2019-10-12 MED FILL — ESCITALOPRAM 20 MG TABLET: 20 | 30 days supply | Qty: 30 | Fill #1

## 2019-11-19 MED FILL — ESCITALOPRAM 20 MG TABLET: 20 | 30 days supply | Qty: 30 | Fill #2

## 2019-11-26 ENCOUNTER — Other Ambulatory Visit: Payer: Self-pay

## 2019-11-26 ENCOUNTER — Ambulatory Visit: Payer: 59 | Admitting: Physician Assistant

## 2019-11-26 ENCOUNTER — Encounter: Payer: Self-pay | Admitting: Physician Assistant

## 2019-11-26 DIAGNOSIS — D239 Other benign neoplasm of skin, unspecified: Secondary | ICD-10-CM

## 2019-11-26 DIAGNOSIS — D2362 Other benign neoplasm of skin of left upper limb, including shoulder: Secondary | ICD-10-CM

## 2019-11-26 DIAGNOSIS — L814 Other melanin hyperpigmentation: Secondary | ICD-10-CM

## 2019-11-26 DIAGNOSIS — Z1283 Encounter for screening for malignant neoplasm of skin: Secondary | ICD-10-CM | POA: Diagnosis not present

## 2019-11-26 DIAGNOSIS — D1800 Hemangioma unspecified site: Secondary | ICD-10-CM

## 2019-11-26 DIAGNOSIS — D229 Melanocytic nevi, unspecified: Secondary | ICD-10-CM

## 2019-11-26 DIAGNOSIS — L578 Other skin changes due to chronic exposure to nonionizing radiation: Secondary | ICD-10-CM

## 2019-11-26 DIAGNOSIS — L821 Other seborrheic keratosis: Secondary | ICD-10-CM | POA: Diagnosis not present

## 2019-11-26 DIAGNOSIS — L820 Inflamed seborrheic keratosis: Secondary | ICD-10-CM

## 2019-11-26 DIAGNOSIS — R21 Rash and other nonspecific skin eruption: Secondary | ICD-10-CM | POA: Diagnosis not present

## 2019-11-26 MED ORDER — TRIAMCINOLONE ACETONIDE 0.1 % EX CREA: 1.0000 "application " | TOPICAL_CREAM | Freq: Two times a day (BID) | CUTANEOUS | 3 refills | Status: DC | PRN

## 2019-11-26 MED FILL — TRIAMCINOLONE 0.1% CREAM: 0.1 | 30 days supply | Qty: 240 | Fill #0

## 2019-11-26 NOTE — Patient Instructions (Addendum)
Preventing Skin Cancer, Adult Skin cancer is the most common type of cancer. There are three main types. Squamous cell and basal cell skin cancer are the most common. Melanoma skin cancer is the most dangerous type. Most skin cancers are caused by skin damage from exposure to ultraviolet (UV) light. UV light comes from the sun and from artificial tanning beds. Suntans and sunburns result from exposure to UV light. Skin cancer occurs most often in older people, but it is usually the result of damage done earlier in life. The tans and sunburns you get at any age can lead to skin cancer in the future. To help prevent this, you can take steps to protect yourself. What actions can I take to protect myself from skin cancer? Many people like to get a tan, especially in the summer or when on vacation. However, tan or burned skin is a sign of skin damage. It increases your risk for skin cancer. To lower your risk: Avoid exposure to UV light   Try to stay out of the sun between 10 a.m. and 4 p.m. whenever possible. This is when the sun is at its strongest. Seek the shade during this time.  Remember that you can also be exposed to UV rays on cloudy or hazy days. Sun exposure can be risky year-round, not just in the summer.  Do not use a sunlamp, tanning bed, or tanning booth to get a tan. If you really want a tan, use an artificial tanning lotion.  Avoid getting sunburned. Sunburns are more common on bright sunny days, especially when you are in areas where the sun is reflected off water or snow. Use sunscreen and protective clothing   Always use sunscreen--either a cream, lotion, or spray--when you are out in the sun. Keep sunscreen handy, such as in your gym bag or in your car, so that you will have it when you need it.  Use a sunscreen with a sun protection factor (SPF) of at least 15. Use an SPF of 30 or higher if you are in bright sun, especially when you are out in the snow or on the water.  Make  sure your sunscreen protects you from UVA and UVB light.  Use an adequate amount of sunscreen to cover exposed areas of skin. Put it on 30 minutes before you go out. Reapply it every 2 hours or anytime you come out of the water.  When you are out in the sun, wear a broad-brimmed hat and clothing that covers your arms and legs. Wear wraparound sunglasses. Check your skin for changes  Check your skin often from head to toe to look for any changes in the size, color, or shape of any moles or freckles. Check for any new moles or moles that bleed or become itchy. See your health care provider if you notice changes.  Ask your health care provider about a total skin check. Ask if it should be part of your yearly physical or if you need to see a skin specialist (dermatologist). Take other preventive measures   Avoid exposure to harmful chemicals, such as arsenic. ? Have your home's water tested for arsenic and other chemicals. ? Take protective measures to avoid exposure to chemicals at work.  Do not smoke any tobacco products, such as cigarettes, cigars, pipes, and e-cigarettes. If you need help quitting, ask your health care provider.  Keep your immune system healthy. ? Stay up to date on all vaccines, including the human papillomavirus (HPV) vaccine. ?   Eat at least 5 servings of fruits and vegetables every day. Why are these changes important? About 1 of every 5 people will get skin cancer. The best way to reduce your risk is to avoid skin damage from UV light. If you have teenagers in your house, they should know that just five bad sunburns as a teen could double their risk of skin cancer in the future. If you have younger children, always make sure to protect their skin from the sun. These changes can help reduce your risk of skin cancer, and they will also provide other health benefits, such as the following:  Protecting your skin from the sun can help prevent painful sunburns, sun poisoning,  and other skin damage and blemishes. This is especially important if: ? You have pale white skin, freckles, and red hair. ? You burn easily.  Avoiding exposure to harmful chemicals can help prevent damage to other tissues in your body, such as your lungs, and prevent other types of cancer.  Avoiding smoking tobacco can reduce your risk for other types of cancer and other health problems.  Eating a healthy diet is good for your overall health. What can happen if changes are not made? If you do not make these changes, you will be at higher risk for skin cancer. If you develop skin cancer, the treatments could result in lost time from work and changes in your appearance from scars. The most dangerous type of skin cancer, melanoma, can be deadly if not found early. Where to find support For more support, talk to your primary health care provider or dermatologist. Where to find more information Learn more about skin cancer from:  The [place]: www.skincancer.org/prevention  The Centers for Disease Control and Prevention: FabVets.se  The American Academy of Dermatology: http://jones-macias.info/ Summary  Skin cancer is the most common type of cancer.  Melanoma skin cancer can be deadly if not found early.  Sunburns and tanning increase your risk for skin cancer.  Protecting your skin from UV light is the best way to prevent skin cancer. This information is not intended to replace advice given to you by your health care provider. Make sure you discuss any questions you have with your health care provider. Document Revised: 07/25/2018 Document Reviewed: 05/27/2017 Elsevier Patient Education  2020 [place] Angioma A cherry angioma is a harmless growth on the skin. It is made up of blood vessels. Cherry angiomas can appear anywhere on the body, but they usually appear on the trunk and arms. What are the causes? The cause of this condition is not known, but it  seems to be related to advancing age. What increases the risk? You are more likely to develop this condition if you:  Are over the age of 45.  Have a family member with this condition. What are the signs or symptoms?   Symptoms of this condition include harmless growths that are: ? Smooth, round, and red or purplish-red. ? As small as the tip of a pin or as big as a pencil eraser. How is this diagnosed? This condition is diagnosed with a skin exam. Rarely, a piece of the cherry angioma may be removed for testing if it is not clear that the growth is a cherry angioma. How is this treated? Treatment is not needed for this condition. If you do not like the way a cherry angioma looks, you may have it removed. Removal methods include:  A method where heat is used to  burn the cherry angioma off the skin (electrocautery).  A method where the cherry angioma is frozen (cryosurgery). This causes it to eventually fall off the skin.  A method where a laser is used to destroy the red blood cells and blood vessels in the angioma (laser therapy).  A minor surgical procedure. A scalpel is used to remove the cherry angioma off the skin. A cherry angioma may come back after it has been removed. Follow these instructions at home:  If you have a cherry angioma removed, keep the area clean and follow any other care instructions as told by your health care provider.  Take over-the-counter and prescription medicines only as told by your health care provider.  Keep all follow-up visits as told by your health care provider. This is important. Summary  A cherry angioma is a harmless growth on the skin that is made up of blood vessels.  Treatment is not needed for this condition.  If you do not like the way a cherry angioma looks, you may have it removed.  If you have a cherry angioma removed, follow any care instructions as told by your health care provider. This information is not intended to  replace advice given to you by your health care provider. Make sure you discuss any questions you have with your health care provider. Document Revised: 10/22/2017 Document Reviewed: 10/22/2017 Elsevier Patient Education  [place].

## 2019-11-26 NOTE — Progress Notes (Addendum)
   New Patient   Subjective  Debra Hess is a 44 y.o. female who presents for the following: Annual Exam (X YEARS BRIDGE OF NOSE, LEFT ARM X MONTHS NO INJURY BUT SHE PICKS).   The following portions of the chart were reviewed this encounter and updated as appropriate: Tobacco  Allergies  Meds  Problems  Med Hx  Surg Hx  Fam Hx      Objective  Well appearing patient in no apparent distress; mood and affect are within normal limits.  All skin waist up examined.  Objective  Left Forearm - Anterior: Skin toned nodule  Objective  Dorsum of Nose: Erythematous stuck-on, waxy plaque.  Objective  Left Lower Leg - Anterior, Right Lower Leg - Anterior: Bite reactions. Red papules, nodules  Objective  waist up: No atypical nevi No signs of non-mole skin cancer.   Assessment & Plan  Dermatofibroma Left Forearm - Anterior  observe  Inflamed seborrheic keratosis Dorsum of Nose  Destruction of lesion - Dorsum of Nose Complexity: simple   Destruction method: cryotherapy   Informed consent: discussed and consent obtained   Timeout:  patient name, date of birth, surgical site, and procedure verified Lesion destroyed using liquid nitrogen: Yes   Cryotherapy cycles:  1 Outcome: patient tolerated procedure well with no complications   Post-procedure details: wound care instructions given    Rash and other nonspecific skin eruption (2) Left Lower Leg - Anterior; Right Lower Leg - Anterior  triamcinolone cream (KENALOG) 0.1 % - Left Lower Leg - Anterior, Right Lower Leg - Anterior  Screening exam for skin cancer waist up Lentigines - Scattered tan macules - Discussed due to sun exposure - Benign, observe - Call for any changes  Seborrheic Keratoses - Stuck-on, waxy, tan-brown papules and plaques  - Discussed benign etiology and prognosis. - Observe - Call for any changes  Melanocytic Nevi - Tan-brown and/or pink-flesh-colored symmetric macules and papules -  Benign appearing on exam today - Observation - Call clinic for new or changing moles - Recommend daily use of broad spectrum spf 30+ sunscreen to sun-exposed areas.   Hemangiomas - Red papules - Discussed benign nature - Observe - Call for any changes  Actinic Damage - diffuse scaly erythematous macules with underlying dyspigmentation - Recommend daily broad spectrum sunscreen SPF 30+ to sun-exposed areas, reapply every 2 hours as needed.  - Call for new or changing lesions.  Skin cancer screening performed today.

## 2020-01-07 MED FILL — ESCITALOPRAM 20 MG TABLET: 20 | 30 days supply | Qty: 30 | Fill #3

## 2020-03-02 ENCOUNTER — Other Ambulatory Visit (HOSPITAL_COMMUNITY): Payer: Self-pay | Admitting: Family Medicine

## 2020-03-02 DIAGNOSIS — E559 Vitamin D deficiency, unspecified: Secondary | ICD-10-CM | POA: Diagnosis not present

## 2020-03-02 DIAGNOSIS — E7849 Other hyperlipidemia: Secondary | ICD-10-CM | POA: Diagnosis not present

## 2020-03-02 DIAGNOSIS — Z6836 Body mass index (BMI) 36.0-36.9, adult: Secondary | ICD-10-CM | POA: Diagnosis not present

## 2020-03-02 DIAGNOSIS — R7309 Other abnormal glucose: Secondary | ICD-10-CM | POA: Diagnosis not present

## 2020-03-02 DIAGNOSIS — G44209 Tension-type headache, unspecified, not intractable: Secondary | ICD-10-CM | POA: Diagnosis not present

## 2020-03-02 DIAGNOSIS — F419 Anxiety disorder, unspecified: Secondary | ICD-10-CM | POA: Diagnosis not present

## 2020-03-02 DIAGNOSIS — Z0001 Encounter for general adult medical examination with abnormal findings: Secondary | ICD-10-CM | POA: Diagnosis not present

## 2020-03-02 MED FILL — rOPINIRole HCL 0.25 MG TABS: 0.25 | 45 days supply | Qty: 90 | Fill #0

## 2020-03-02 MED FILL — ESCITALOPRAM 20 MG TABLET: 20 | 30 days supply | Qty: 30 | Fill #0

## 2020-03-02 MED FILL — LEVOTHYROXINE 88 MCG TABLET: 88 | 90 days supply | Qty: 90 | Fill #0

## 2020-03-08 MED FILL — LEVOTHYROXINE 100 MCG TABLE: 100 | 90 days supply | Qty: 90 | Fill #0

## 2020-04-08 ENCOUNTER — Ambulatory Visit: Payer: Self-pay

## 2020-04-11 ENCOUNTER — Other Ambulatory Visit: Payer: 59

## 2020-04-11 DIAGNOSIS — Z20822 Contact with and (suspected) exposure to covid-19: Secondary | ICD-10-CM | POA: Diagnosis not present

## 2020-04-12 LAB — SARS-COV-2, NAA 2 DAY TAT

## 2020-04-12 LAB — NOVEL CORONAVIRUS, NAA: SARS-CoV-2, NAA: DETECTED — AB

## 2020-04-28 MED FILL — ESCITALOPRAM 20 MG TABLET: 20 | 30 days supply | Qty: 30 | Fill #1

## 2020-06-17 MED FILL — ESCITALOPRAM 20 MG TABLET: 20 | 30 days supply | Qty: 30 | Fill #2

## 2020-07-14 ENCOUNTER — Emergency Department (HOSPITAL_COMMUNITY): Payer: 59

## 2020-07-14 ENCOUNTER — Other Ambulatory Visit: Payer: Self-pay

## 2020-07-14 ENCOUNTER — Emergency Department (HOSPITAL_COMMUNITY)
Admission: EM | Admit: 2020-07-14 | Discharge: 2020-07-14 | Disposition: A | Discharge status: Home / Self Care | Payer: 59 | Attending: Emergency Medicine | Admitting: Emergency Medicine

## 2020-07-14 ENCOUNTER — Encounter (HOSPITAL_COMMUNITY): Payer: Self-pay | Admitting: Emergency Medicine

## 2020-07-14 DIAGNOSIS — R42 Dizziness and giddiness: Secondary | ICD-10-CM | POA: Insufficient documentation

## 2020-07-14 DIAGNOSIS — Z79899 Other long term (current) drug therapy: Secondary | ICD-10-CM | POA: Diagnosis not present

## 2020-07-14 DIAGNOSIS — E039 Hypothyroidism, unspecified: Secondary | ICD-10-CM | POA: Insufficient documentation

## 2020-07-14 DIAGNOSIS — R000 Tachycardia, unspecified: Secondary | ICD-10-CM | POA: Diagnosis not present

## 2020-07-14 DIAGNOSIS — Z96652 Presence of left artificial knee joint: Secondary | ICD-10-CM | POA: Diagnosis not present

## 2020-07-14 DIAGNOSIS — J029 Acute pharyngitis, unspecified: Secondary | ICD-10-CM | POA: Insufficient documentation

## 2020-07-14 DIAGNOSIS — Z8616 Personal history of COVID-19: Secondary | ICD-10-CM | POA: Insufficient documentation

## 2020-07-14 DIAGNOSIS — R0602 Shortness of breath: Secondary | ICD-10-CM | POA: Diagnosis not present

## 2020-07-14 HISTORY — DX: Disorder of thyroid, unspecified: E07.9

## 2020-07-14 LAB — CBC WITH DIFFERENTIAL/PLATELET
Abs Immature Granulocytes: 0.03 10*3/uL (ref 0.00–0.07)
Basophils Absolute: 0.1 10*3/uL (ref 0.0–0.1)
Basophils Relative: 1 %
Eosinophils Absolute: 0.3 10*3/uL (ref 0.0–0.5)
Eosinophils Relative: 3 %
HCT: 40.7 % (ref 36.0–46.0)
Hemoglobin: 13.1 g/dL (ref 12.0–15.0)
Immature Granulocytes: 0 %
Lymphocytes Relative: 27 %
Lymphs Abs: 2.9 10*3/uL (ref 0.7–4.0)
MCH: 29.2 pg (ref 26.0–34.0)
MCHC: 32.2 g/dL (ref 30.0–36.0)
MCV: 90.8 fL (ref 80.0–100.0)
Monocytes Absolute: 1.1 10*3/uL — ABNORMAL HIGH (ref 0.1–1.0)
Monocytes Relative: 10 %
Neutro Abs: 6.3 10*3/uL (ref 1.7–7.7)
Neutrophils Relative %: 59 %
Platelets: 276 10*3/uL (ref 150–400)
RBC: 4.48 MIL/uL (ref 3.87–5.11)
RDW: 13.6 % (ref 11.5–15.5)
WBC: 10.7 10*3/uL — ABNORMAL HIGH (ref 4.0–10.5)
nRBC: 0 % (ref 0.0–0.2)

## 2020-07-14 LAB — HEPATIC FUNCTION PANEL
ALT: 16 U/L (ref 0–44)
AST: 18 U/L (ref 15–41)
Albumin: 3.9 g/dL (ref 3.5–5.0)
Alkaline Phosphatase: 50 U/L (ref 38–126)
Bilirubin, Direct: 0.1 mg/dL (ref 0.0–0.2)
Indirect Bilirubin: 0.5 mg/dL (ref 0.3–0.9)
Total Bilirubin: 0.6 mg/dL (ref 0.3–1.2)
Total Protein: 7.2 g/dL (ref 6.5–8.1)

## 2020-07-14 LAB — TSH: TSH: 5.215 u[IU]/mL — ABNORMAL HIGH (ref 0.350–4.500)

## 2020-07-14 LAB — BASIC METABOLIC PANEL
Anion gap: 8 (ref 5–15)
BUN: 14 mg/dL (ref 6–20)
CO2: 26 mmol/L (ref 22–32)
Calcium: 8.9 mg/dL (ref 8.9–10.3)
Chloride: 102 mmol/L (ref 98–111)
Creatinine, Ser: 0.64 mg/dL (ref 0.44–1.00)
GFR, Estimated: >60 mL/min (ref >60)
Glucose, Bld: 109 mg/dL — ABNORMAL HIGH (ref 70–99)
Potassium: 3.9 mmol/L (ref 3.5–5.1)
Sodium: 136 mmol/L (ref 135–145)

## 2020-07-14 LAB — BRAIN NATRIURETIC PEPTIDE: B Natriuretic Peptide: 25 pg/mL (ref 0.0–100.0)

## 2020-07-14 LAB — D-DIMER, QUANTITATIVE: D-Dimer, Quant: 0.34 ug/mL-FEU (ref 0.00–0.50)

## 2020-07-14 LAB — TROPONIN I (HIGH SENSITIVITY)
Troponin I (High Sensitivity): 2 ng/L (ref <18)
Troponin I (High Sensitivity): 3 ng/L (ref <18)

## 2020-07-14 MED ORDER — AEROCHAMBER PLUS FLO-VU SMALL MISC
1.0000 | Freq: Once | Status: DC
  Filled 2020-07-14: qty 1

## 2020-07-14 MED ORDER — ALBUTEROL SULFATE HFA 108 (90 BASE) MCG/ACT IN AERS
2.0000 | INHALATION_SPRAY | Freq: Once | RESPIRATORY_TRACT | Status: AC
  Administered 2020-07-14: 2 via RESPIRATORY_TRACT
  Filled 2020-07-14: qty 6.7

## 2020-07-14 NOTE — ED Provider Notes (Signed)
[facility] EMERGENCY DEPARTMENT Provider Note   CSN: [id number] Arrival date & time: 07/14/20  1726     History Chief Complaint  Patient presents with  . Shortness of Breath    Debra Hess is a 45 y.o. female who presents with concern for SOB progressively worsening over the last 3 days. She states her child was ill last week; the patient subsequently developed a sore throat which started 4 days ago, but resolved within 48 hours. She endorses HA, denies blurry vision, double vision. Endorses lightheadedness, denies dizziness. Denies N/V/D, abdominal pain. Denies cough, chest pain, but endorses palpitations which is not new for her.  She had COVID-19 in December of 2021. Denies fevers or chills at home.   Patient endorses recent travel, driving to and from [place] approximately 3 to 4 hours for work.  She denies prolonged immobilization or recent surgical procedure.  She has hormonal IUD but otherwise does not have any hormone placement therapy.  She denies any history of DVT or malignancy. She voices concern for PE, given hx of COVID in December 2021.   Of note patient endorses new left-sided breast lump times a few months for which she has an appointment to follow-up with her OB/GYN, though she has not been evaluated up to this point.  Patient currently in the hallway, will perform breast exam once patient is in a room.  I personally reviewed this patient's medical records.  She is history of hypothyroidism and is poorly compliant with her Synthroid.  HPI     Past Medical History:  Diagnosis Date  . Thyroid disease     There are no problems to display for this patient.   Past Surgical History:  Procedure Laterality Date  . CESAREAN SECTION    . EAR TUBE REMOVAL    . KNEE ARTHROPLASTY Left      OB History    Gravida      Para      Term      Preterm      AB      Living  3     SAB      IAB      Ectopic      Multiple      Live Births               History reviewed. No pertinent family history.  Social History   Tobacco Use  . Smoking status: Never Smoker  . Smokeless tobacco: Never Used  Vaping Use  . Vaping Use: Never used  Substance Use Topics  . Alcohol use: Yes  . Drug use: Never    Home Medications Prior to Admission medications   Medication Sig Start Date End Date Taking? Authorizing Provider  escitalopram (LEXAPRO) 20 MG tablet Take 20 mg by mouth daily. 11/19/19   [provider]  levothyroxine (SYNTHROID) 88 MCG tablet Take 88 mcg by mouth daily. 08/21/19   [provider]  ondansetron (ZOFRAN) 4 MG tablet Take 4-8 mg by mouth every 6 (six) hours as needed. 07/08/19   [provider]  rOPINIRole (REQUIP) 0.25 MG tablet Take 0.25 mg by mouth 2 (two) times daily. 08/21/19   [provider]  triamcinolone cream (KENALOG) 0.1 % Apply 1 application topically 2 (two) times daily as needed. 11/26/19   Warren Danes, PA-C    Allergies    Amoxapine and related, Sulfa antibiotics, and Wellbutrin [bupropion]  Review of Systems   Review of Systems  Constitutional: Negative  for activity change, appetite change, chills, diaphoresis, fatigue and fever.  HENT: Positive for sore throat. Negative for congestion, trouble swallowing and voice change.   Respiratory: Positive for shortness of breath. Negative for cough, chest tightness and wheezing.   Cardiovascular: Positive for palpitations. Negative for chest pain and leg swelling.  Gastrointestinal: Negative for abdominal pain, diarrhea, nausea and vomiting.  Genitourinary: Negative.   Musculoskeletal: Negative.   Skin: Negative.   Neurological: Positive for light-headedness and headaches. Negative for dizziness, tremors, seizures, syncope, speech difficulty, weakness and numbness.  Hematological: Negative.     Physical Exam Updated Vital Signs BP (!) 121/97 (BP Location: Right Arm)   Pulse 96   Temp 97.9 F (36.6 C) (Temporal)    Resp 20   Ht 5\' 1"  (1.549 m)   Wt 84.8 kg   SpO2 100%   BMI 35.33 kg/m   Physical Exam Vitals and nursing note reviewed.  Constitutional:      Appearance: She is overweight. She is not ill-appearing or toxic-appearing.  HENT:     Head: Normocephalic and atraumatic.     Nose: Nose normal.     Mouth/Throat:     Mouth: Mucous membranes are moist.     Pharynx: Oropharynx is clear. Uvula midline. No oropharyngeal exudate or posterior oropharyngeal erythema.     Tonsils: No tonsillar exudate.  Eyes:     General: Lids are normal. Vision grossly intact.        Right eye: No discharge.        Left eye: No discharge.     Extraocular Movements: Extraocular movements intact.     Conjunctiva/sclera: Conjunctivae normal.     Pupils: Pupils are equal, round, and reactive to light.  Neck:     Thyroid: No thyromegaly.     Vascular: No JVD.     Trachea: Trachea and phonation normal.  Cardiovascular:     Rate and Rhythm: Regular rhythm. Tachycardia present.     Pulses: Normal pulses.          Radial pulses are 2+ on the right side and 2+ on the left side.       Dorsalis pedis pulses are 2+ on the right side and 2+ on the left side.     Heart sounds: Normal heart sounds. No murmur heard.   Pulmonary:     Effort: Pulmonary effort is normal. No tachypnea, bradypnea or respiratory distress.     Breath sounds: Normal breath sounds. No wheezing or rales.  Chest:     Chest wall: No mass, lacerations, deformity, swelling, tenderness, crepitus or edema.  Abdominal:     General: Bowel sounds are normal. There is no distension.     Palpations: Abdomen is soft. There is no mass.     Tenderness: There is no abdominal tenderness. There is no guarding.  Musculoskeletal:        General: No deformity.     Cervical back: Normal range of motion and neck supple. No rigidity or crepitus. No pain with movement, spinous process tenderness or muscular tenderness.     Right lower leg: No tenderness. No edema.      Left lower leg: No tenderness. No edema.  Lymphadenopathy:     Cervical: No cervical adenopathy.  Skin:    General: Skin is warm and dry.     Capillary Refill: Capillary refill takes less than 2 seconds.  Neurological:     General: No focal deficit present.     Mental Status: She is  alert and oriented to person, place, and time. Mental status is at baseline.     Sensory: Sensation is intact.     Motor: Motor function is intact.  Psychiatric:        Mood and Affect: Mood normal.     ED Results / Procedures / Treatments   Labs (all labs ordered are listed, but only abnormal results are displayed) Labs Reviewed  CBC WITH DIFFERENTIAL/PLATELET - Abnormal; Notable for the following components:      Result Value   WBC 10.7 (*)    Monocytes Absolute 1.1 (*)    All other components within normal limits  BASIC METABOLIC PANEL - Abnormal; Notable for the following components:   Glucose, Bld 109 (*)    All other components within normal limits  TSH - Abnormal; Notable for the following components:   TSH 5.215 (*)    All other components within normal limits  RESP PANEL BY RT-PCR (FLU A&B, COVID) ARPGX2  D-DIMER, QUANTITATIVE  HEPATIC FUNCTION PANEL  BRAIN NATRIURETIC PEPTIDE  TROPONIN I (HIGH SENSITIVITY)  TROPONIN I (HIGH SENSITIVITY)    EKG None  EKG: sinus tachycardia, no STEMI.   Radiology DG Chest 2 View  Result Date: 07/14/2020 CLINICAL DATA:  Shortness of breath, lightheadedness and fatigue with headache for 5 days EXAM: CHEST - 2 VIEW COMPARISON:  01/18/2019 FINDINGS: No consolidation, features of edema, pneumothorax, or effusion. Pulmonary vascularity is normally distributed. The cardiomediastinal contours are unremarkable. No acute osseous or soft tissue abnormality. Midthoracic dextrocurvature is similar to prior. IMPRESSION: No acute cardiopulmonary abnormality. Electronically Signed   By: Lovena Le M.D.   On: 07/14/2020 18:29     Procedures Procedures  Medications Ordered in ED Medications  albuterol (VENTOLIN HFA) 108 (90 Base) MCG/ACT inhaler 2 puff (has no administration in time range)  AeroChamber Plus Flo-Vu Small device MISC 1 each (has no administration in time range)    ED Course  I have reviewed the triage vital signs and the nursing notes.  Pertinent labs & imaging results that were available during my care of the patient were reviewed by me and considered in my medical decision making (see chart for details).  Clinical Course as of 07/14/20 2148  Thu Jul 14, 2020  1821 Presented to [facility] to evaluate the patient; she is not there. Will recheck in a few minutes.   [RS]    Clinical Course User Index [RS] Lynsay Fesperman, Sharlene Dory   MDM Rules/Calculators/A&P                         45 year old female presents with concern for 3 days of progressively worsening shortness of breath.  Vital signs are normal on intake.  Cardiopulmonary exam is normal, abdominal exam is benign.  Patient is neurovascular intact in all 4 extremities without peripheral edema.  HEENT exam is benign.  Differential diagnosis for this patient symptoms includes but is not limited to pulmonary embolism, ACS, arrhythmia, pneumonia, bronchoconstriction/reactive airway disease, pulmonary edema, pleural effusion, heart failure, thyroid anomaly given palpitations, electrolyte derangement, pneumothorax, metabolic derangement.  Can not use PERC criteria for this patient as she is tachycardic to 110 bmp at time of my exam. Will obtain basic labs, + troponin, d-dimer, and TSH.   CXR negative for acute cardiopulmonary disease. CBC with leukocytosis of 10.7.  BMP is unremarkable, hepatic function panel is unremarkable, troponin is normal, 2.  Delta troponin negative, 3.  BNP pending.  TSH is mildly elevated  to 5.2, per patient most recently was 6 at her outpatient visit, so it appears this has improved.  She endorses inconsistency taking  her Synthroid.  D-dimer is reassuring, negative, 0.34.  BNP is normal, 25.  Respiratory pathogen panel pending, patient may follow-up on this in her MyChart app outpatient.  Given reassuring physical exam, vital signs, laboratory, and imaging studies, no further work-up warranted in ED at this time.  Suspect a component of reactive airway disease, as well as contribution of poorly managed hypothyroidism.  Will administer 2 puffs of albuterol for possible reactive airway component and recommend patient begin taking OTC allergy medication.  Additionally discussed at length the importance of her consistently taking her levothyroxine, as well as her following up regularly with her primary care doctor.  Wynema voiced understanding of her medical evaluation and treatment plan.  Each of her questions was answered to her expressed satisfaction.  Return precautions given.  Patient is well-appearing, stable, and appropriate for discharge at this time.  This chart was dictated using voice recognition software, Dragon. Despite the best efforts of this provider to proofread and correct errors, errors may still occur which can change documentation meaning.  Final Clinical Impression(s) / ED Diagnoses Final diagnoses:  None    Rx / DC Orders ED Discharge Orders    None       Aura Dials 07/14/20 2148    Milton Ferguson, MD 07/14/20 2241

## 2020-07-14 NOTE — Discharge Instructions (Signed)
You were seen in the ER today for your shortness of breath.  Your physical exam, vital signs, blood work, chest x-ray, and EKG were very reassuring.  There is no sign of heart attack, there is no blood clot in your lungs, and there is no sign of infection in your lungs and your chest x-ray.  Your thyroid appears poorly managed, as your TSH remains elevated to 5 at this time.  It is extremely important that you become consistent taking your levothyroxine at home and follow-up closely with your primary care doctor.  Additionally below is the contact information for Dr. Domenic Polite, the cardiologist on call with whom you may follow-up for your palpitations should they persist.  Additionally there is likely a component of reactive airway/allergies to you the symptoms you are experiencing at this time.  Recommend you take over-the-counter allergy medication and use your albuterol as needed at home for chest tightness.  Return to the ER if you develop chest pain, worsening palpitations, shortness of breath, nausea or vomiting does not stop, or any other new severe symptoms.

## 2020-07-14 NOTE — ED Triage Notes (Signed)
Pt c/o SOB with light-headedness, fatigue and headache x 5 days

## 2020-07-16 ENCOUNTER — Other Ambulatory Visit (HOSPITAL_COMMUNITY): Payer: Self-pay

## 2020-07-21 ENCOUNTER — Other Ambulatory Visit: Payer: Self-pay | Admitting: Family Medicine

## 2020-07-21 DIAGNOSIS — Z1231 Encounter for screening mammogram for malignant neoplasm of breast: Secondary | ICD-10-CM

## 2020-07-23 ENCOUNTER — Other Ambulatory Visit: Payer: Self-pay | Admitting: Obstetrics and Gynecology

## 2020-07-23 DIAGNOSIS — N63 Unspecified lump in unspecified breast: Secondary | ICD-10-CM

## 2020-07-26 ENCOUNTER — Other Ambulatory Visit (HOSPITAL_COMMUNITY): Payer: Self-pay

## 2020-09-01 ENCOUNTER — Other Ambulatory Visit: Payer: 59

## 2020-09-09 ENCOUNTER — Ambulatory Visit: Payer: 59

## 2020-09-30 ENCOUNTER — Ambulatory Visit
Admission: RE | Admit: 2020-09-30 | Discharge: 2020-09-30 | Disposition: A | Discharge status: Home / Self Care | Payer: 59 | Source: Ambulatory Visit | Attending: Obstetrics and Gynecology | Admitting: Obstetrics and Gynecology

## 2020-09-30 ENCOUNTER — Other Ambulatory Visit: Payer: Self-pay

## 2020-09-30 ENCOUNTER — Other Ambulatory Visit: Payer: Self-pay | Admitting: Obstetrics and Gynecology

## 2020-09-30 DIAGNOSIS — N6489 Other specified disorders of breast: Secondary | ICD-10-CM

## 2020-09-30 DIAGNOSIS — R928 Other abnormal and inconclusive findings on diagnostic imaging of breast: Secondary | ICD-10-CM | POA: Diagnosis not present

## 2020-09-30 DIAGNOSIS — D241 Benign neoplasm of right breast: Secondary | ICD-10-CM | POA: Diagnosis not present

## 2020-09-30 DIAGNOSIS — N63 Unspecified lump in unspecified breast: Secondary | ICD-10-CM

## 2020-10-19 ENCOUNTER — Other Ambulatory Visit: Payer: Self-pay

## 2020-10-19 ENCOUNTER — Ambulatory Visit
Admission: RE | Admit: 2020-10-19 | Discharge: 2020-10-19 | Disposition: A | Discharge status: Home / Self Care | Payer: 59 | Source: Ambulatory Visit | Attending: Obstetrics and Gynecology | Admitting: Obstetrics and Gynecology

## 2020-10-19 DIAGNOSIS — N6489 Other specified disorders of breast: Secondary | ICD-10-CM

## 2020-10-19 DIAGNOSIS — N62 Hypertrophy of breast: Secondary | ICD-10-CM | POA: Diagnosis not present

## 2020-10-19 DIAGNOSIS — R928 Other abnormal and inconclusive findings on diagnostic imaging of breast: Secondary | ICD-10-CM | POA: Diagnosis not present

## 2020-10-25 ENCOUNTER — Other Ambulatory Visit (HOSPITAL_COMMUNITY): Payer: Self-pay

## 2020-10-25 MED FILL — Escitalopram Oxalate Tab 20 MG (Base Equiv): ORAL | 30 days supply | Qty: 30 | Fill #0 | Status: AC

## 2020-12-11 MED FILL — Escitalopram Oxalate Tab 20 MG (Base Equiv): ORAL | 30 days supply | Qty: 30 | Fill #1 | Status: AC

## 2020-12-12 ENCOUNTER — Other Ambulatory Visit (HOSPITAL_COMMUNITY): Payer: Self-pay

## 2021-01-27 ENCOUNTER — Other Ambulatory Visit (HOSPITAL_COMMUNITY): Payer: Self-pay

## 2021-01-27 MED FILL — Levothyroxine Sodium Tab 88 MCG: ORAL | 90 days supply | Qty: 90 | Fill #0 | Status: AC

## 2021-04-04 ENCOUNTER — Telehealth: Payer: 59 | Admitting: Physician Assistant

## 2021-04-04 DIAGNOSIS — R3989 Other symptoms and signs involving the genitourinary system: Secondary | ICD-10-CM | POA: Diagnosis not present

## 2021-04-04 MED ORDER — CEPHALEXIN 500 MG PO CAPS: 500.0000 mg | ORAL_CAPSULE | Freq: Two times a day (BID) | ORAL | 0 refills | Status: AC

## 2021-04-04 NOTE — Patient Instructions (Signed)
Lysle Morales, thank you for joining Leeanne Rio, PA-C for today's virtual visit.  While this provider is not your primary care provider (PCP), if your PCP is located in our provider database this encounter information will be shared with them immediately following your visit.  Consent: (Patient) Debra Hess provided verbal consent for this virtual visit at the beginning of the encounter.  Current Medications:  Current Outpatient Medications:    cephALEXin (KEFLEX) 500 MG capsule, Take 1 capsule (500 mg total) by mouth 2 (two) times daily for 7 days., Disp: 14 capsule, Rfl: 0   escitalopram (LEXAPRO) 20 MG tablet, TAKE 1 TABLET BY MOUTH ONCE DAILY, Disp: 30 tablet, Rfl: 5   levothyroxine (SYNTHROID) 88 MCG tablet, Take 1 tablet (88 mcg total) by mouth daily. Recheck in 6 weeks, Disp: 90 tablet, Rfl: 1   ondansetron (ZOFRAN) 4 MG tablet, Take 4-8 mg by mouth every 6 (six) hours as needed., Disp: , Rfl:    rOPINIRole (REQUIP) 0.25 MG tablet, TAKE 1 TO 2 TABLETS BY MOUTH 1-3 HOURS BEFORE BEDTIME, Disp: 90 tablet, Rfl: 1   triamcinolone cream (KENALOG) 0.1 %, Apply 1 application topically 2 (two) times daily as needed., Disp: 240 g, Rfl: 3   Medications ordered in this encounter:  Meds ordered this encounter  Medications   cephALEXin (KEFLEX) 500 MG capsule    Sig: Take 1 capsule (500 mg total) by mouth 2 (two) times daily for 7 days.    Dispense:  14 capsule    Refill:  0    Order Specific Question:   Supervising Provider    Answer:   Sabra Heck, [place]     *If you need refills on other medications prior to your next appointment, please contact your pharmacy*  Follow-Up: Call back or seek an in-person evaluation if the symptoms worsen or if the condition fails to improve as anticipated.  Other Instructions Your symptoms are consistent with a bladder infection, also called acute cystitis. Please take your antibiotic (Keflex) as directed until all pills are gone.  Stay very  well hydrated.  Consider a daily probiotic (Align, Culturelle, or Activia) to help prevent stomach upset caused by the antibiotic.  Taking a probiotic daily may also help prevent recurrent UTIs.  Also consider taking AZO (Phenazopyridine) tablets to help decrease pain with urination.    Urinary Tract Infection A urinary tract infection (UTI) can occur any place along the urinary tract. The tract includes the kidneys, ureters, bladder, and urethra. A type of germ called bacteria often causes a UTI. UTIs are often helped with antibiotic medicine.  HOME CARE  If given, take antibiotics as told by your doctor. Finish them even if you start to feel better. Drink enough fluids to keep your pee (urine) clear or pale yellow. Avoid tea, drinks with caffeine, and bubbly (carbonated) drinks. Pee often. Avoid holding your pee in for a long time. Pee before and after having sex (intercourse). Wipe from front to back after you poop (bowel movement) if you are a woman. Use each tissue only once. GET HELP RIGHT AWAY IF:  You have back pain. You have lower belly (abdominal) pain. You have chills. You feel sick to your stomach (nauseous). You throw up (vomit). Your burning or discomfort with peeing does not go away. You have a fever. Your symptoms are not better in 3 days. MAKE SURE YOU:  Understand these instructions. Will watch your condition. Will get help right away if you are not doing  well or get worse. Document Released: 09/19/2007 Document Revised: 12/26/2011 Document Reviewed: 11/01/2011 [facility] Patient Information 2015 [place], [place]. This information is not intended to replace advice given to you by your health care provider. Make sure you discuss any questions you have with your health care provider.    If you have been instructed to have an in-person evaluation today at a local Urgent Care facility, please use the link below. It will take you to a list of all of our available [place]  [facility], including address, phone number and hours of operation. Please do not delay care.  [facility]  If you or a family member do not have a primary care provider, use the link below to schedule a visit and establish care. When you choose a [facility] primary care physician or advanced practice provider, you gain a long-term partner in health. Find a Primary Care Provider  Learn more about [place]'s in-office and virtual care options: [facility]

## 2021-04-04 NOTE — Progress Notes (Signed)
Virtual Visit Consent   Debra Hess, you are scheduled for a virtual visit with a [facility] provider today.     Just as with appointments in the office, your consent must be obtained to participate.  Your consent will be active for this visit and any virtual visit you may have with one of our providers in the next 365 days.     If you have a MyChart account, a copy of this consent can be sent to you electronically.  All virtual visits are billed to your insurance company just like a traditional visit in the office.    As this is a virtual visit, video technology does not allow for your provider to perform a traditional examination.  This may limit your provider's ability to fully assess your condition.  If your provider identifies any concerns that need to be evaluated in person or the need to arrange testing (such as labs, EKG, etc.), we will make arrangements to do so.     Although advances in technology are sophisticated, we cannot ensure that it will always work on either your end or our end.  If the connection with a video visit is poor, the visit may have to be switched to a telephone visit.  With either a video or telephone visit, we are not always able to ensure that we have a secure connection.     I need to obtain your verbal consent now.   Are you willing to proceed with your visit today?    Debra Hess has provided verbal consent on 04/04/2021 for a virtual visit (video or telephone).   Leeanne Rio, [place]   Date: 04/04/2021 11:33 AM   Virtual Visit via Video Note   I, Leeanne Rio, connected with  Debra Hess  ([id number], 05/31/1975) on 04/04/21 at 11:30 AM EST by a video-enabled telemedicine application and verified that I am speaking with the correct person using two identifiers.  Location: Patient: Virtual Visit Location Patient: Home Provider: Virtual Visit Location Provider: Home Office   I discussed the limitations of evaluation and management  by telemedicine and the availability of in person appointments. The patient expressed understanding and agreed to proceed.    History of Present Illness: Debra Hess is a 45 y.o. who identifies as a female who was assigned female at birth, and is being seen today for possible UTI. Notes waking up this morning with significant urinary urgency, frequency, suprapubic pressure, dysuria. Denies hematuria, fever, chills, vomiting. Denies vaginal symptoms. Has history of UTI and this feels identical to previous episodes. Typically will be given Keflex with resolution of symptoms.   HPI: HPI  Problems: There are no problems to display for this patient.   Allergies:  Allergies  Allergen Reactions   Amoxapine And Related    Sulfa Antibiotics    Wellbutrin [Bupropion]    Penicillins Rash   Medications:  Current Outpatient Medications:    cephALEXin (KEFLEX) 500 MG capsule, Take 1 capsule (500 mg total) by mouth 2 (two) times daily for 7 days., Disp: 14 capsule, Rfl: 0   escitalopram (LEXAPRO) 20 MG tablet, TAKE 1 TABLET BY MOUTH ONCE DAILY, Disp: 30 tablet, Rfl: 5   levothyroxine (SYNTHROID) 88 MCG tablet, Take 1 tablet (88 mcg total) by mouth daily. Recheck in 6 weeks, Disp: 90 tablet, Rfl: 1   ondansetron (ZOFRAN) 4 MG tablet, Take 4-8 mg by mouth every 6 (six) hours as needed., Disp: , Rfl:  rOPINIRole (REQUIP) 0.25 MG tablet, TAKE 1 TO 2 TABLETS BY MOUTH 1-3 HOURS BEFORE BEDTIME, Disp: 90 tablet, Rfl: 1   triamcinolone cream (KENALOG) 0.1 %, Apply 1 application topically 2 (two) times daily as needed., Disp: 240 g, Rfl: 3  Observations/Objective: Patient is well-developed, well-nourished in no acute distress.  Resting comfortably at home.  Head is normocephalic, atraumatic.  No labored breathing. Speech is clear and coherent with logical content.  Patient is alert and oriented at baseline.   Assessment and Plan: 1. Suspected UTI - cephALEXin (KEFLEX) 500 MG capsule; Take 1 capsule  (500 mg total) by mouth 2 (two) times daily for 7 days.  Dispense: 14 capsule; Refill: 0  Mild symptoms. No alarm signs/symptoms present. + history of UTI. Will treat empirically with Keflex 500 mg BID x 7 days. Supportive measures and OTC medications reviewed. Strict in-person follow-up precautions reviewed.   Follow Up Instructions: I discussed the assessment and treatment plan with the patient. The patient was provided an opportunity to ask questions and all were answered. The patient agreed with the plan and demonstrated an understanding of the instructions.  A copy of instructions were sent to the patient via MyChart unless otherwise noted below.   The patient was advised to call back or seek an in-person evaluation if the symptoms worsen or if the condition fails to improve as anticipated.  Time:  I spent 10 minutes with the patient via telehealth technology discussing the above problems/concerns.    Leeanne Rio, PA-C

## 2021-05-22 ENCOUNTER — Other Ambulatory Visit: Payer: Self-pay | Admitting: Obstetrics and Gynecology

## 2021-05-22 DIAGNOSIS — N6489 Other specified disorders of breast: Secondary | ICD-10-CM

## 2021-06-07 ENCOUNTER — Other Ambulatory Visit: Payer: 59

## 2021-06-09 ENCOUNTER — Ambulatory Visit: Payer: 59

## 2021-06-09 ENCOUNTER — Ambulatory Visit
Admission: RE | Admit: 2021-06-09 | Discharge: 2021-06-09 | Disposition: A | Discharge status: Home / Self Care | Payer: 59 | Source: Ambulatory Visit | Attending: Obstetrics and Gynecology | Admitting: Obstetrics and Gynecology

## 2021-06-09 DIAGNOSIS — N6489 Other specified disorders of breast: Secondary | ICD-10-CM

## 2021-06-09 DIAGNOSIS — R922 Inconclusive mammogram: Secondary | ICD-10-CM | POA: Diagnosis not present

## 2021-08-30 DIAGNOSIS — F419 Anxiety disorder, unspecified: Secondary | ICD-10-CM | POA: Diagnosis not present

## 2021-08-30 DIAGNOSIS — E7849 Other hyperlipidemia: Secondary | ICD-10-CM | POA: Diagnosis not present

## 2021-08-30 DIAGNOSIS — Z0000 Encounter for general adult medical examination without abnormal findings: Secondary | ICD-10-CM | POA: Diagnosis not present

## 2021-08-30 DIAGNOSIS — D509 Iron deficiency anemia, unspecified: Secondary | ICD-10-CM | POA: Diagnosis not present

## 2021-08-30 DIAGNOSIS — N390 Urinary tract infection, site not specified: Secondary | ICD-10-CM | POA: Diagnosis not present

## 2021-08-30 DIAGNOSIS — Z6831 Body mass index (BMI) 31.0-31.9, adult: Secondary | ICD-10-CM | POA: Diagnosis not present

## 2021-08-30 DIAGNOSIS — Z1331 Encounter for screening for depression: Secondary | ICD-10-CM | POA: Diagnosis not present

## 2021-08-30 DIAGNOSIS — E559 Vitamin D deficiency, unspecified: Secondary | ICD-10-CM | POA: Diagnosis not present

## 2021-08-30 DIAGNOSIS — E782 Mixed hyperlipidemia: Secondary | ICD-10-CM | POA: Diagnosis not present

## 2021-08-30 DIAGNOSIS — E6609 Other obesity due to excess calories: Secondary | ICD-10-CM | POA: Diagnosis not present

## 2021-08-30 DIAGNOSIS — E039 Hypothyroidism, unspecified: Secondary | ICD-10-CM | POA: Diagnosis not present

## 2021-08-30 DIAGNOSIS — R7309 Other abnormal glucose: Secondary | ICD-10-CM | POA: Diagnosis not present

## 2021-09-27 DIAGNOSIS — Z124 Encounter for screening for malignant neoplasm of cervix: Secondary | ICD-10-CM | POA: Diagnosis not present

## 2021-09-27 DIAGNOSIS — Z0142 Encounter for cervical smear to confirm findings of recent normal smear following initial abnormal smear: Secondary | ICD-10-CM | POA: Diagnosis not present

## 2021-09-27 DIAGNOSIS — Z113 Encounter for screening for infections with a predominantly sexual mode of transmission: Secondary | ICD-10-CM | POA: Diagnosis not present

## 2021-09-27 DIAGNOSIS — Z01411 Encounter for gynecological examination (general) (routine) with abnormal findings: Secondary | ICD-10-CM | POA: Diagnosis not present

## 2021-09-27 DIAGNOSIS — Z6832 Body mass index (BMI) 32.0-32.9, adult: Secondary | ICD-10-CM | POA: Diagnosis not present

## 2021-09-27 DIAGNOSIS — Z114 Encounter for screening for human immunodeficiency virus [HIV]: Secondary | ICD-10-CM | POA: Diagnosis not present

## 2021-09-27 DIAGNOSIS — Z1231 Encounter for screening mammogram for malignant neoplasm of breast: Secondary | ICD-10-CM | POA: Diagnosis not present

## 2021-09-27 DIAGNOSIS — Z118 Encounter for screening for other infectious and parasitic diseases: Secondary | ICD-10-CM | POA: Diagnosis not present

## 2021-09-27 DIAGNOSIS — Z1159 Encounter for screening for other viral diseases: Secondary | ICD-10-CM | POA: Diagnosis not present

## 2021-09-27 DIAGNOSIS — Z01419 Encounter for gynecological examination (general) (routine) without abnormal findings: Secondary | ICD-10-CM | POA: Diagnosis not present

## 2021-09-28 ENCOUNTER — Other Ambulatory Visit: Payer: Self-pay | Admitting: Obstetrics and Gynecology

## 2021-09-28 DIAGNOSIS — R928 Other abnormal and inconclusive findings on diagnostic imaging of breast: Secondary | ICD-10-CM

## 2021-10-03 ENCOUNTER — Other Ambulatory Visit: Payer: 59

## 2021-10-04 ENCOUNTER — Ambulatory Visit
Admission: RE | Admit: 2021-10-04 | Discharge: 2021-10-04 | Disposition: A | Discharge status: Home / Self Care | Payer: 59 | Source: Ambulatory Visit | Attending: Obstetrics and Gynecology | Admitting: Obstetrics and Gynecology

## 2021-10-04 DIAGNOSIS — R928 Other abnormal and inconclusive findings on diagnostic imaging of breast: Secondary | ICD-10-CM

## 2021-10-04 DIAGNOSIS — N6001 Solitary cyst of right breast: Secondary | ICD-10-CM | POA: Diagnosis not present

## 2021-10-05 DIAGNOSIS — D509 Iron deficiency anemia, unspecified: Secondary | ICD-10-CM | POA: Diagnosis not present

## 2021-10-05 DIAGNOSIS — W57XXXA Bitten or stung by nonvenomous insect and other nonvenomous arthropods, initial encounter: Secondary | ICD-10-CM | POA: Diagnosis not present

## 2021-12-31 IMAGING — DX DG CHEST 2V
2 series · 2 of 2 positions shown · non-contrast
Comparison: 01/18/2019

CLINICAL DATA: Shortness of breath, lightheadedness and fatigue
with headache for 5 days

EXAM:
CHEST - 2 VIEW

[chest pa]
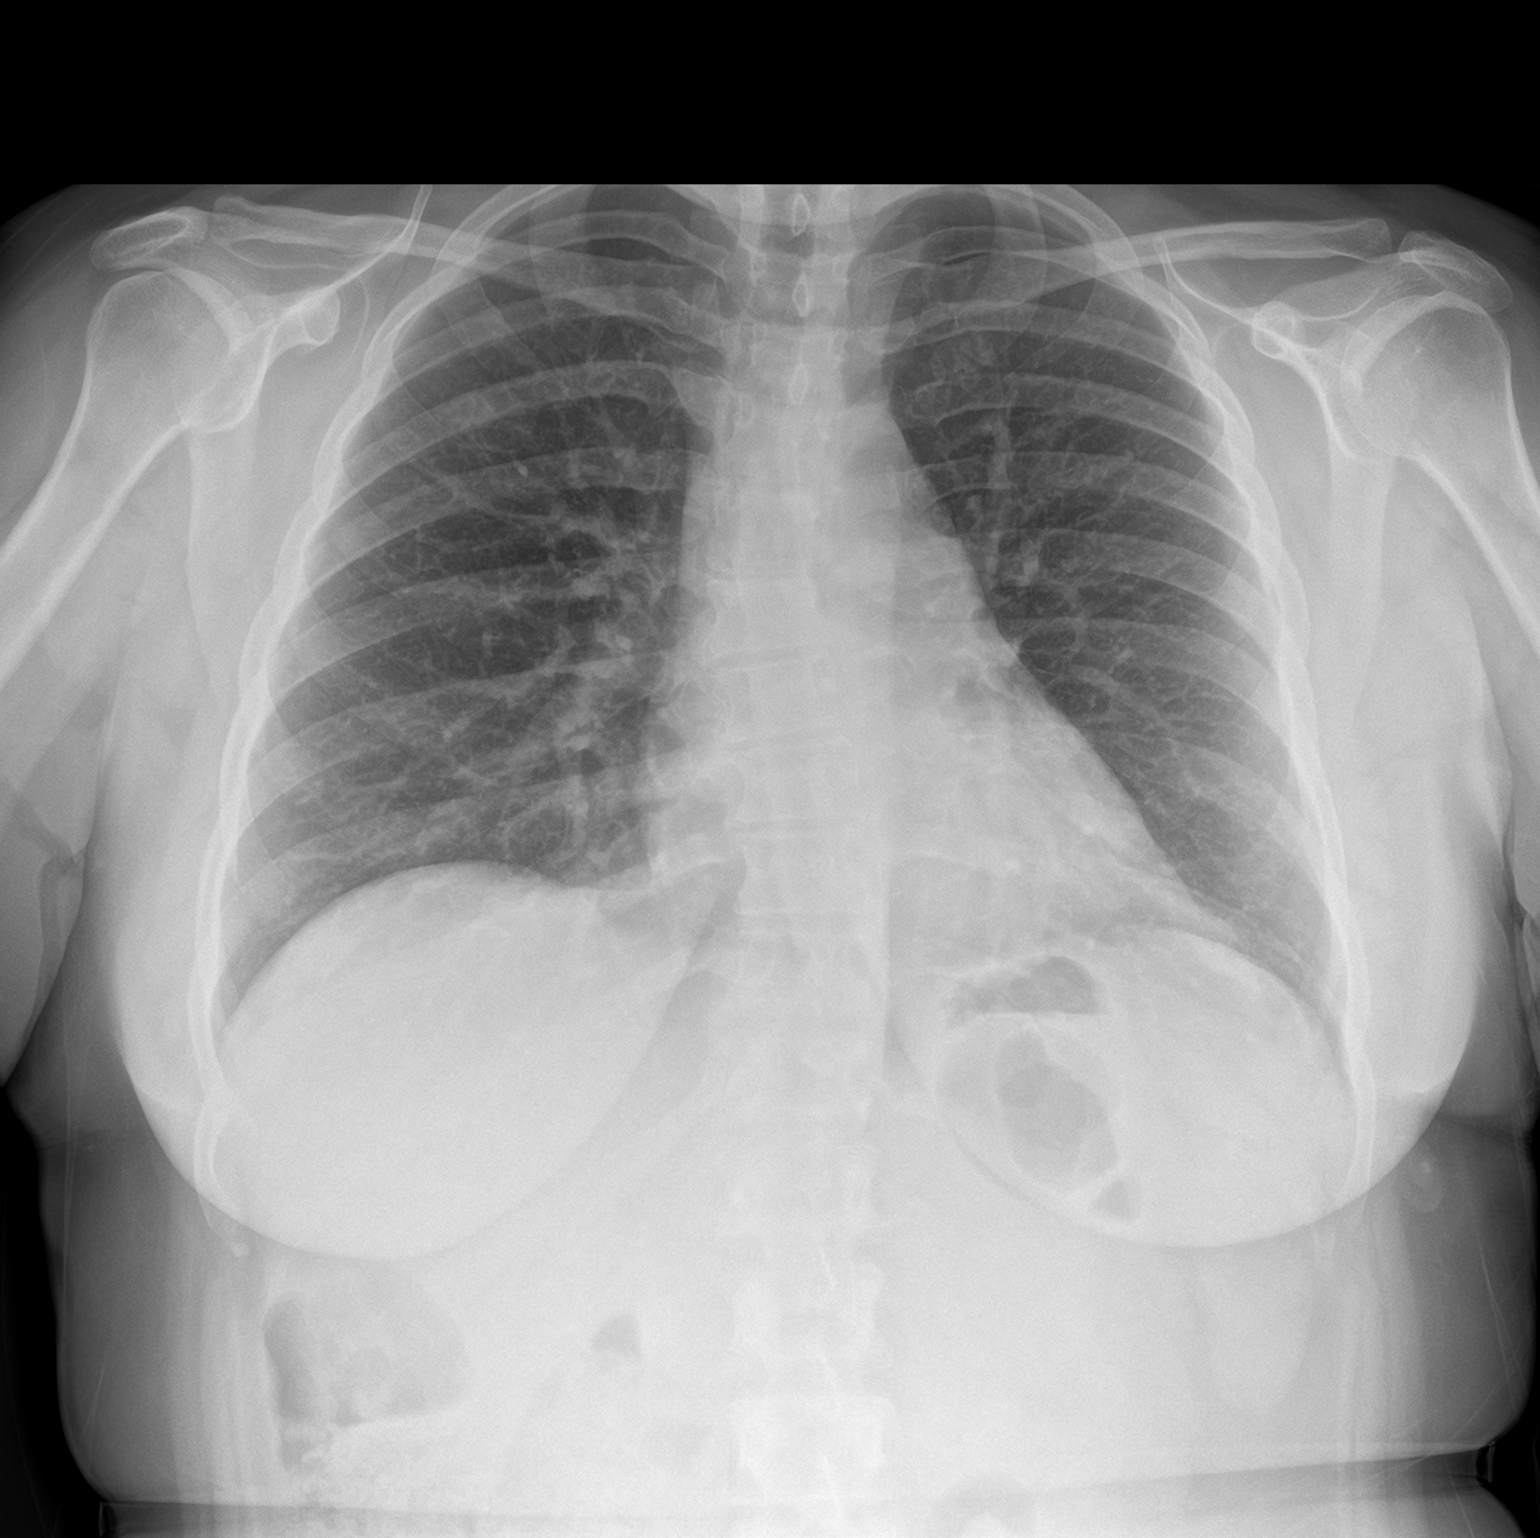

[chest lat]
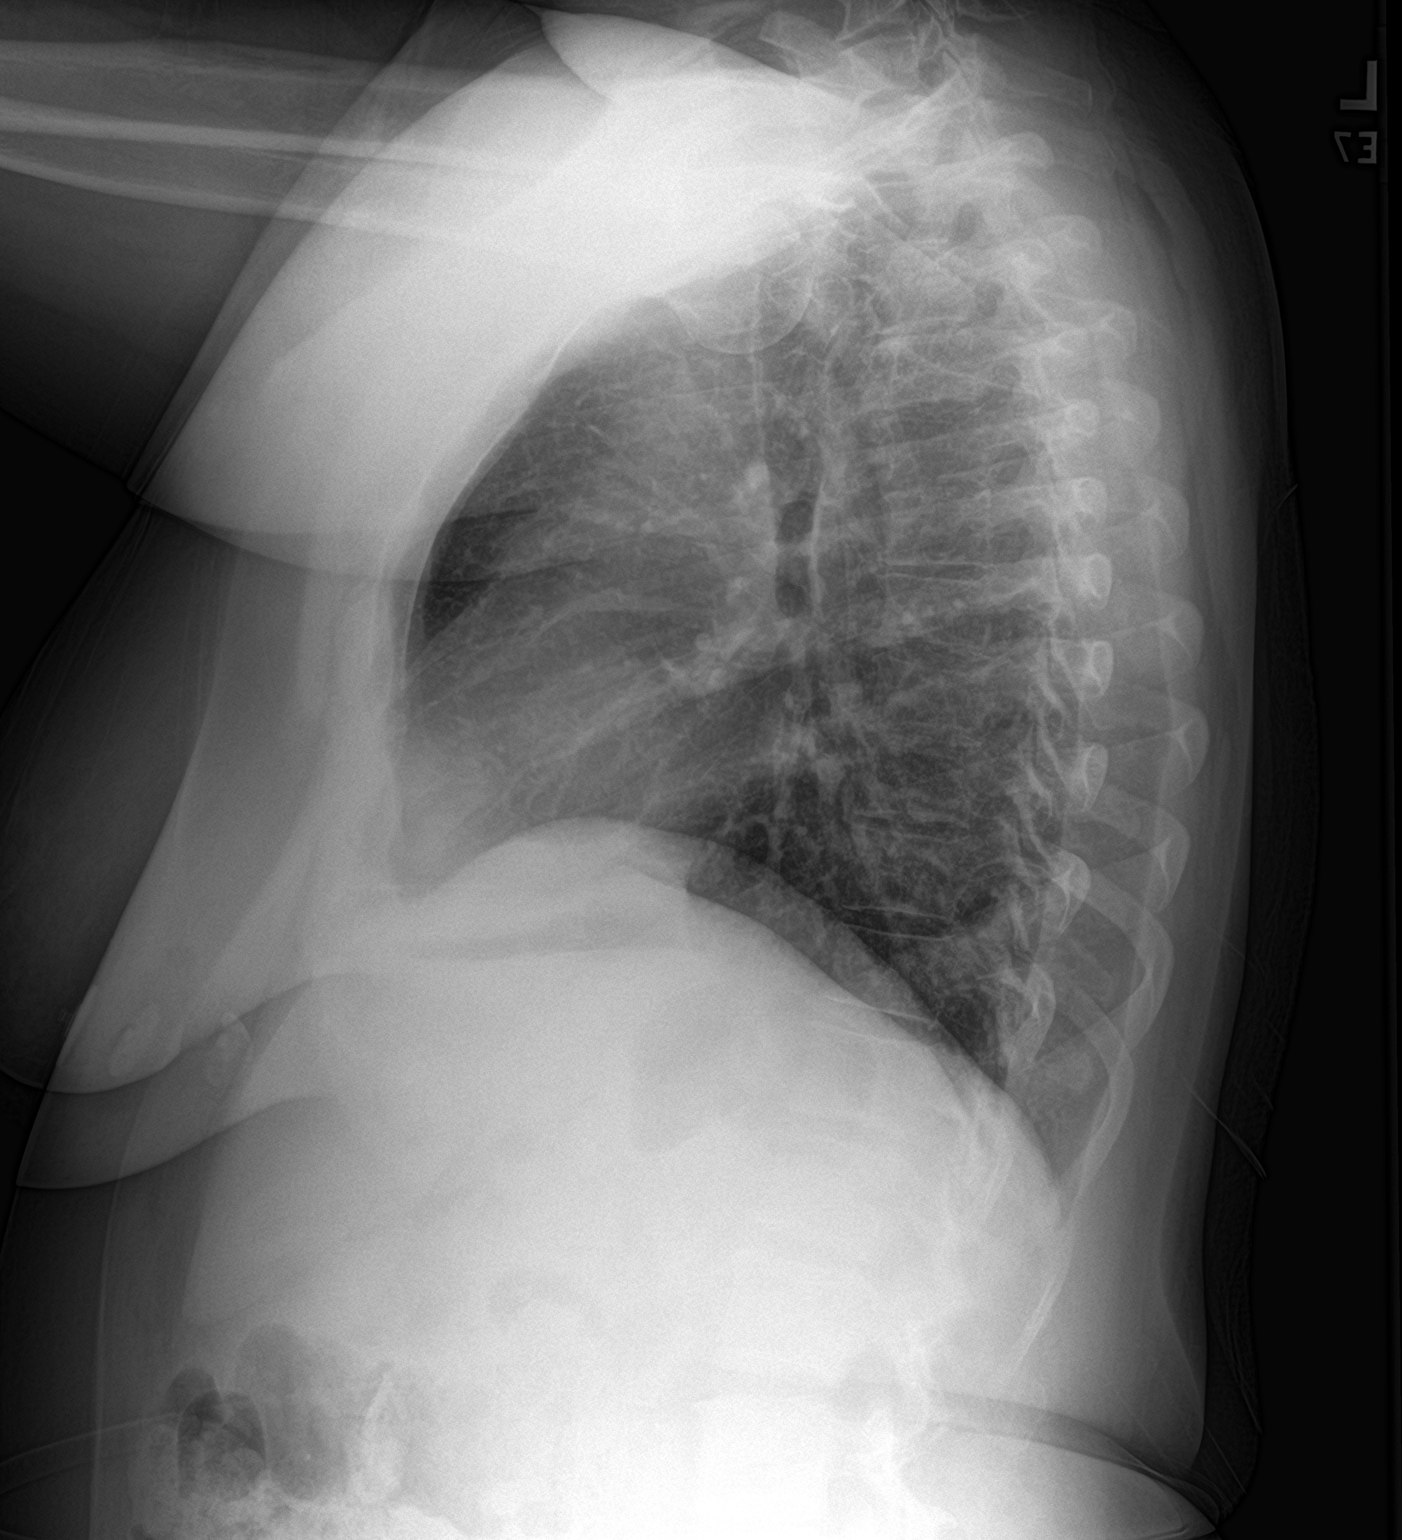

[2 of 2 positions shown; findings below may reference images not displayed]

FINDINGS: No consolidation, features of edema, pneumothorax, or effusion.
Pulmonary vascularity is normally distributed. The cardiomediastinal
contours are unremarkable. No acute osseous or soft tissue
abnormality. Midthoracic dextrocurvature is similar to prior.
IMPRESSION: No acute cardiopulmonary abnormality.

## 2022-01-12 ENCOUNTER — Other Ambulatory Visit (HOSPITAL_COMMUNITY): Payer: Self-pay

## 2022-01-15 ENCOUNTER — Other Ambulatory Visit (HOSPITAL_COMMUNITY): Payer: Self-pay

## 2022-01-15 MED ORDER — LEVOTHYROXINE SODIUM 88 MCG PO TABS
88.0000 ug | ORAL_TABLET | Freq: Every day | ORAL | 3 refills | Status: AC
Start: 2022-01-15 — End: ?
  Filled 2022-01-15 – 2022-01-22 (×2): qty 90, 90d supply, fill #0
  Filled 2022-05-11: qty 90, 90d supply, fill #1
  Filled 2022-09-17: qty 90, 90d supply, fill #0

## 2022-01-16 ENCOUNTER — Other Ambulatory Visit (HOSPITAL_COMMUNITY): Payer: Self-pay

## 2022-01-19 ENCOUNTER — Other Ambulatory Visit (HOSPITAL_COMMUNITY): Payer: Self-pay

## 2022-01-22 ENCOUNTER — Other Ambulatory Visit (HOSPITAL_COMMUNITY): Payer: Self-pay

## 2022-04-07 IMAGING — MG MM BREAST BX W LOC DEV 1ST LESION IMAGE BX SPEC STEREO GUIDE*L*
8 of 13 series · 8 of 37 positions shown · non-contrast
Comparison: Previous exams.
COMPARISON: Previous exams.

Addendum:
CLINICAL DATA: 45-year-old female with an indeterminate left breast
asymmetry.

EXAM:
LEFT BREAST STEREOTACTIC CORE NEEDLE BIOPSY

[L (1 of 6)]
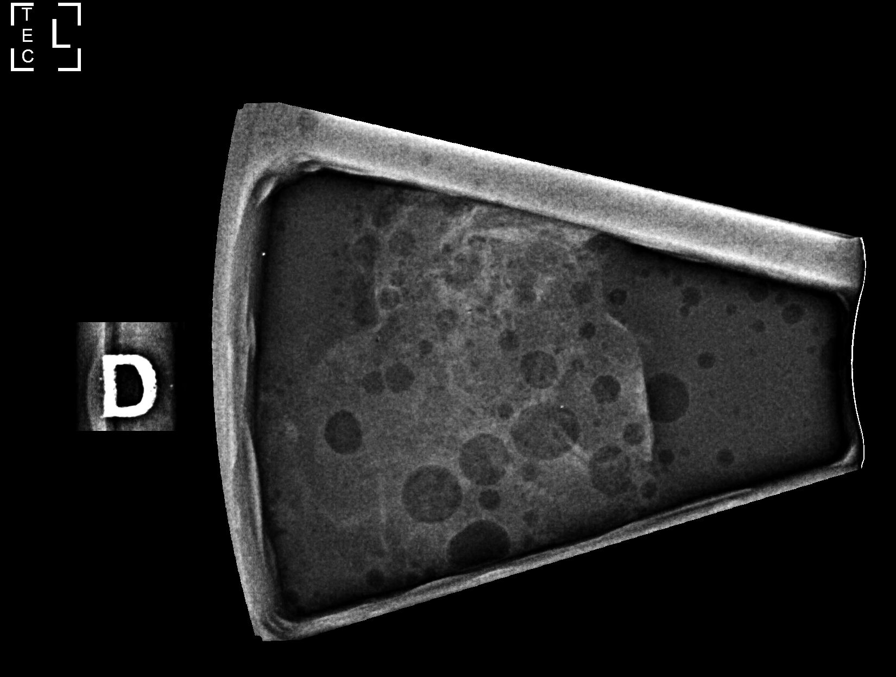

[L (2 of 6)]
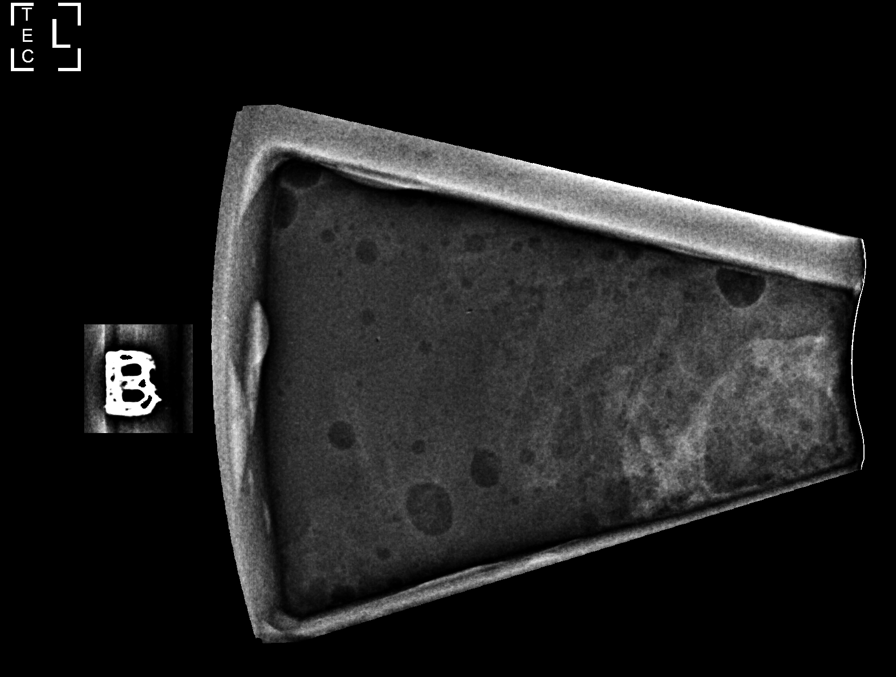

[L (3 of 6)]
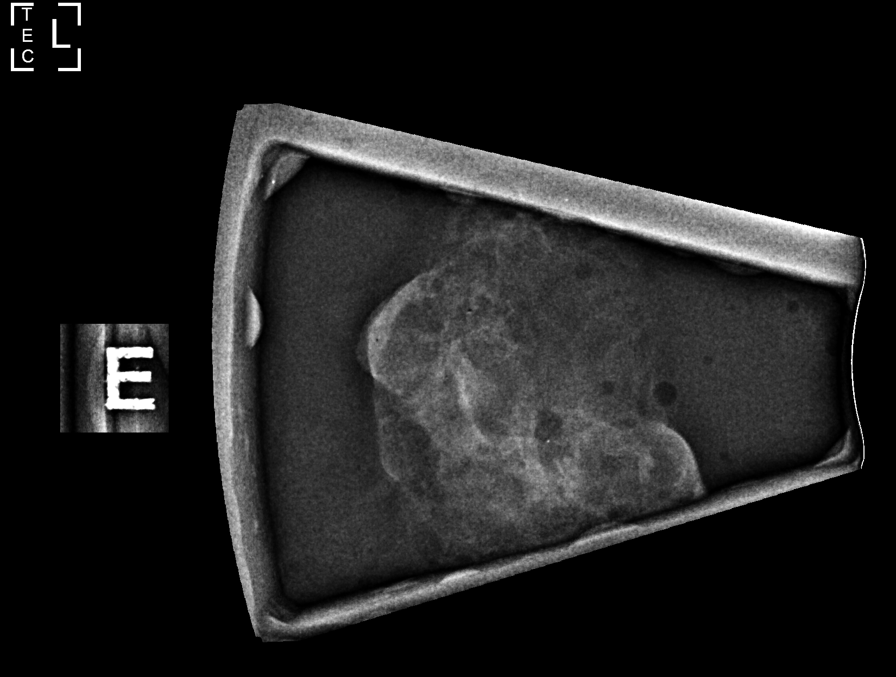

[L (4 of 6)]
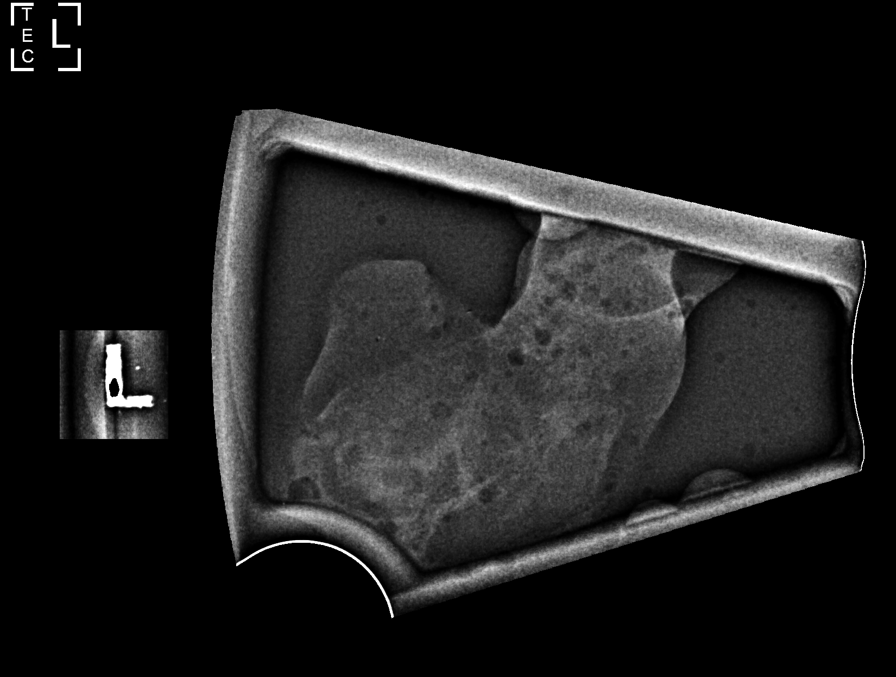

[L (5 of 6)]
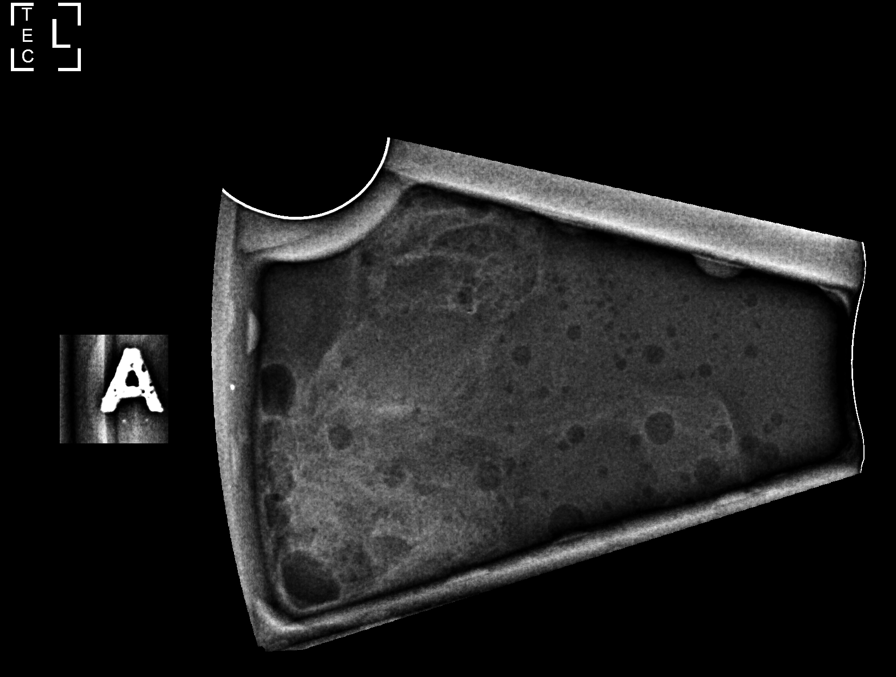

[L (6 of 6)]
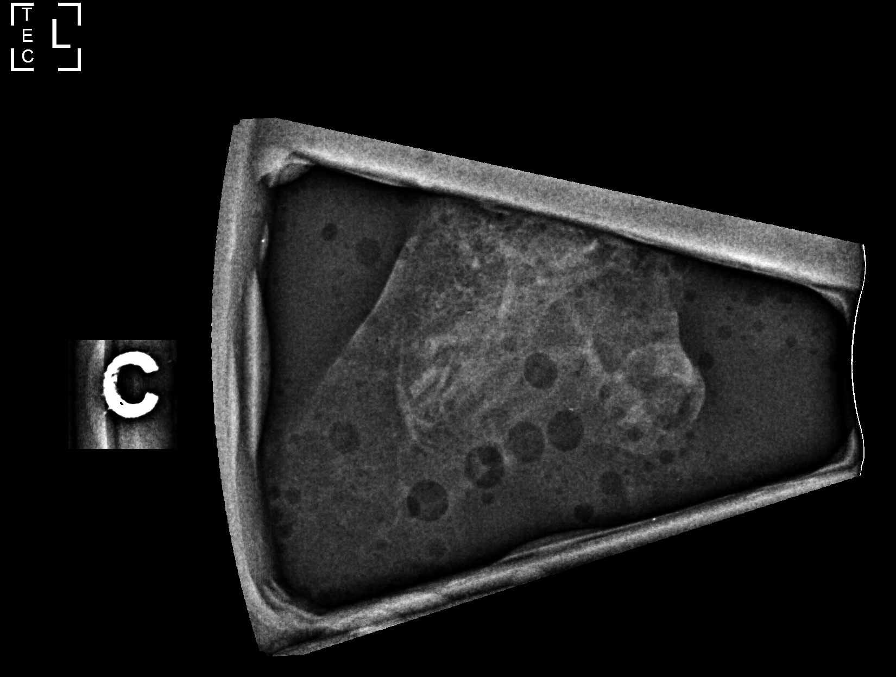

[L ML]
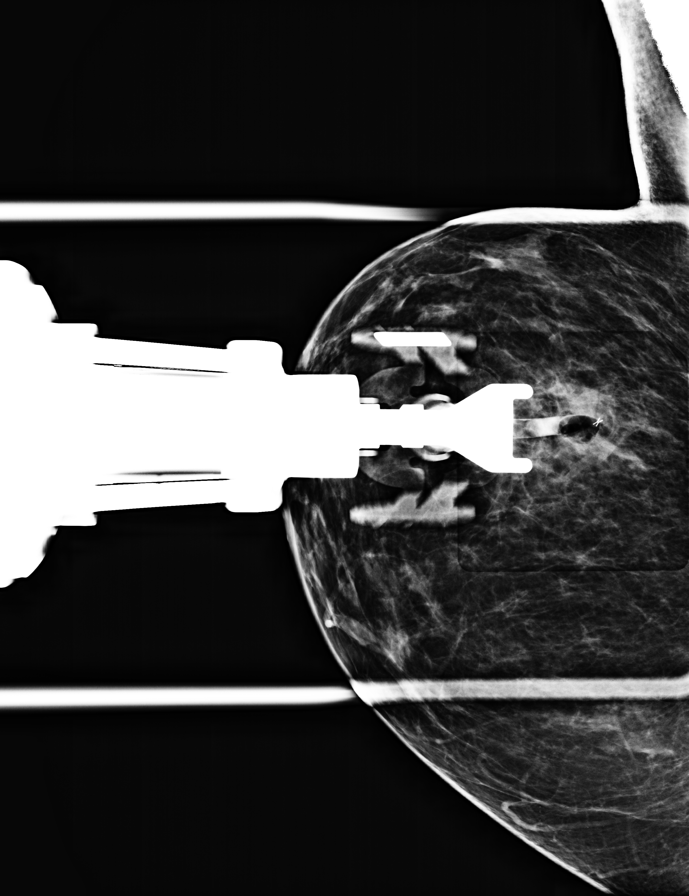

[L ML tomo · tomo slice 34/67.0]
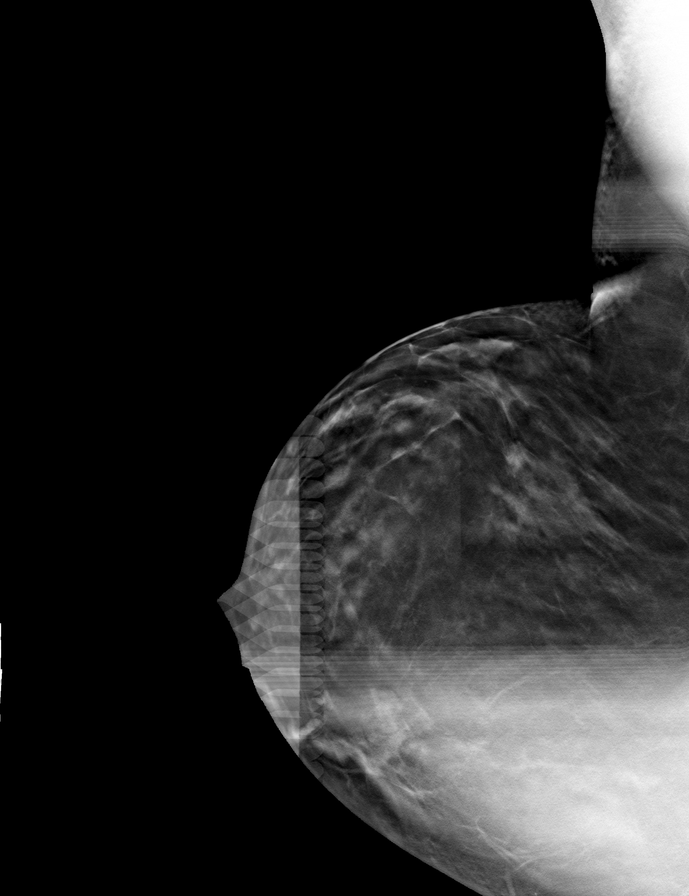

[8 of 37 positions shown; findings below may reference images not displayed]



Using sterile technique and 1% Lidocaine as local anesthetic, under
stereotactic guidance, a 9 gauge vacuum assisted device was used to
perform core needle biopsy of an asymmetry in the lower inner
quadrant of the left breast using a medial approach.

Lesion quadrant: Lower inner quadrant

At the conclusion of the procedure, an X shaped tissue marker clip
was deployed into the biopsy cavity. Follow-up 2-view mammogram was
performed and dictated separately.
IMPRESSION: Stereotactic-guided biopsy of the left breast. No apparent
complications.

ADDENDUM:
Pathology revealed FOCAL USUAL DUCTAL HYPERPLASIA AND
PSEUDOANGIOMATOUS STROMAL HYPERPLASIA (PASH)- NO MALIGNANCY
IDENTIFIED of the LEFT breast, lower inner quadrant, posterior. This
was found to be concordant by Dr. Yusniel Waldner.

Pathology results were discussed with the patient by telephone. The
patient reported doing well after the biopsy with tenderness at the
site. Post biopsy instructions and care were reviewed and questions
were answered. The patient was encouraged to call The [REDACTED]

The patient was instructed to return for left diagnostic mammography
an possible ultrasound in 6 months and informed a reminder notice
would be sent regarding this appointment.

Pathology results reported by Alngel Fraga RN on 10/21/2020.



Using sterile technique and 1% Lidocaine as local anesthetic, under
stereotactic guidance, a 9 gauge vacuum assisted device was used to
perform core needle biopsy of an asymmetry in the lower inner
quadrant of the left breast using a medial approach.

Lesion quadrant: Lower inner quadrant

At the conclusion of the procedure, an X shaped tissue marker clip
was deployed into the biopsy cavity. Follow-up 2-view mammogram was
performed and dictated separately.
IMPRESSION: Stereotactic-guided biopsy of the left breast. No apparent
complications.

## 2022-05-11 ENCOUNTER — Other Ambulatory Visit (HOSPITAL_COMMUNITY): Payer: Self-pay

## 2022-05-11 ENCOUNTER — Other Ambulatory Visit: Payer: Self-pay

## 2022-05-15 DIAGNOSIS — S139XXA Sprain of joints and ligaments of unspecified parts of neck, initial encounter: Secondary | ICD-10-CM | POA: Diagnosis not present

## 2022-05-16 ENCOUNTER — Other Ambulatory Visit: Payer: Self-pay

## 2022-06-15 ENCOUNTER — Other Ambulatory Visit (HOSPITAL_COMMUNITY): Payer: Self-pay

## 2022-09-17 ENCOUNTER — Other Ambulatory Visit (HOSPITAL_COMMUNITY): Payer: Self-pay

## 2022-09-17 ENCOUNTER — Other Ambulatory Visit: Payer: Self-pay

## 2022-09-20 ENCOUNTER — Other Ambulatory Visit: Payer: Self-pay

## 2022-09-20 ENCOUNTER — Encounter (HOSPITAL_COMMUNITY): Payer: Self-pay

## 2022-09-20 ENCOUNTER — Other Ambulatory Visit (HOSPITAL_COMMUNITY): Payer: Self-pay

## 2022-09-21 ENCOUNTER — Other Ambulatory Visit (HOSPITAL_COMMUNITY): Payer: Self-pay

## 2022-10-05 ENCOUNTER — Other Ambulatory Visit: Payer: Self-pay | Admitting: Obstetrics and Gynecology

## 2022-10-05 DIAGNOSIS — Z1231 Encounter for screening mammogram for malignant neoplasm of breast: Secondary | ICD-10-CM

## 2022-10-12 DIAGNOSIS — D509 Iron deficiency anemia, unspecified: Secondary | ICD-10-CM | POA: Diagnosis not present

## 2022-10-12 DIAGNOSIS — E039 Hypothyroidism, unspecified: Secondary | ICD-10-CM | POA: Diagnosis not present

## 2022-10-12 DIAGNOSIS — E559 Vitamin D deficiency, unspecified: Secondary | ICD-10-CM | POA: Diagnosis not present

## 2022-10-12 DIAGNOSIS — E7849 Other hyperlipidemia: Secondary | ICD-10-CM | POA: Diagnosis not present

## 2022-11-12 ENCOUNTER — Ambulatory Visit
Admission: RE | Admit: 2022-11-12 | Discharge: 2022-11-12 | Disposition: A | Discharge status: Home / Self Care | Payer: Commercial Managed Care - PPO | Source: Ambulatory Visit | Attending: Obstetrics and Gynecology | Admitting: Obstetrics and Gynecology

## 2022-11-12 DIAGNOSIS — Z1231 Encounter for screening mammogram for malignant neoplasm of breast: Secondary | ICD-10-CM

## 2023-01-22 ENCOUNTER — Other Ambulatory Visit (HOSPITAL_COMMUNITY): Payer: Self-pay

## 2023-01-23 ENCOUNTER — Other Ambulatory Visit: Payer: Self-pay

## 2023-01-23 ENCOUNTER — Other Ambulatory Visit (HOSPITAL_COMMUNITY): Payer: Self-pay

## 2023-01-23 MED ORDER — ERGOCALCIFEROL 1.25 MG (50000 UT) PO CAPS
50000.0000 [IU] | ORAL_CAPSULE | ORAL | 2 refills | Status: DC
  Filled 2023-01-23: qty 4, 28d supply, fill #0

## 2023-01-23 MED ORDER — CYCLOBENZAPRINE HCL 10 MG PO TABS: 10.0000 mg | ORAL_TABLET | Freq: Three times a day (TID) | ORAL | 2 refills | Status: AC

## 2023-01-23 MED ORDER — LEVOTHYROXINE SODIUM 88 MCG PO TABS
88.0000 ug | ORAL_TABLET | Freq: Every day | ORAL | 3 refills | Status: AC
  Filled 2023-01-23: qty 90, 90d supply, fill #0
  Filled 2023-05-30: qty 90, 90d supply, fill #1

## 2023-01-24 ENCOUNTER — Other Ambulatory Visit: Payer: Self-pay

## 2023-01-24 ENCOUNTER — Other Ambulatory Visit (HOSPITAL_COMMUNITY): Payer: Self-pay

## 2023-02-28 ENCOUNTER — Ambulatory Visit (HOSPITAL_BASED_OUTPATIENT_CLINIC_OR_DEPARTMENT_OTHER): Payer: Commercial Managed Care - PPO | Admitting: Orthopaedic Surgery

## 2023-02-28 ENCOUNTER — Ambulatory Visit (HOSPITAL_BASED_OUTPATIENT_CLINIC_OR_DEPARTMENT_OTHER): Payer: Commercial Managed Care - PPO

## 2023-02-28 DIAGNOSIS — M65312 Trigger thumb, left thumb: Secondary | ICD-10-CM | POA: Diagnosis not present

## 2023-02-28 DIAGNOSIS — M25542 Pain in joints of left hand: Secondary | ICD-10-CM

## 2023-02-28 DIAGNOSIS — M1812 Unilateral primary osteoarthritis of first carpometacarpal joint, left hand: Secondary | ICD-10-CM | POA: Diagnosis not present

## 2023-02-28 NOTE — Progress Notes (Signed)
Chief Complaint: left thumb pain     History of Present Illness:    Debra Hess is a 47 y.o. female presents today with left thumb pain.  She is right-hand dominant.  This has been ongoing for the last several months and is worse with repetitive motion.  She has experienced pain about the dorsal aspect of the palm as well as the A1 pulley.  Gripping and opposition are difficult for her.  She works as a Engineer, civil (consulting) at [employer]    PMH/PSH/Family History/Social History/Meds/Allergies:    Past Medical History:  Diagnosis Date   Thyroid disease    Past Surgical History:  Procedure Laterality Date   CESAREAN SECTION     EAR TUBE REMOVAL     KNEE ARTHROPLASTY Left    Social History   Socioeconomic History   Marital status: Married    Spouse name: Not on file   Number of children: Not on file   Years of education: Not on file   Highest education level: Not on file  Occupational History   Not on file  Tobacco Use   Smoking status: Never   Smokeless tobacco: Never  Vaping Use   Vaping status: Never Used  Substance and Sexual Activity   Alcohol use: Yes   Drug use: Never   Sexual activity: Not on file  Other Topics Concern   Not on file  Social History Narrative   Not on file   Social Determinants of Health   Financial Resource Strain: Not on file  Food Insecurity: Not on file  Transportation Needs: Not on file  Physical Activity: Not on file  Stress: Not on file  Social Connections: Not on file   Family History  Problem Relation Age of Onset   Breast cancer Sister 11   Breast cancer Paternal Grandmother 80   Allergies  Allergen Reactions   Amoxapine And Related    Sulfa Antibiotics    Wellbutrin [Bupropion]    Penicillins Rash   Current Outpatient Medications  Medication Sig Dispense Refill   cyclobenzaprine (FLEXERIL) 10 MG tablet Take 1 tablet (10 mg total) by mouth 3 (three) times daily. 45 tablet 2   ergocalciferol (VITAMIN D2) 1.25 MG (50000 UT)  capsule Take 1 capsule (50,000 Units total) by mouth every 7 (seven) days. Take 2000 units daily after finishing course 4 capsule 2   escitalopram (LEXAPRO) 20 MG tablet TAKE 1 TABLET BY MOUTH ONCE DAILY 30 tablet 5   levothyroxine (SYNTHROID) 88 MCG tablet Take 1 tablet (88 mcg total) by mouth daily. 90 tablet 3   levothyroxine (SYNTHROID) 88 MCG tablet Take 1 tablet (88 mcg total) by mouth daily. 90 tablet 3   ondansetron (ZOFRAN) 4 MG tablet Take 4-8 mg by mouth every 6 (six) hours as needed.     rOPINIRole (REQUIP) 0.25 MG tablet TAKE 1 TO 2 TABLETS BY MOUTH 1-3 HOURS BEFORE BEDTIME 90 tablet 1   triamcinolone cream (KENALOG) 0.1 % Apply 1 application topically 2 (two) times daily as needed. 240 g 3   No current facility-administered medications for this visit.   No results found.  Review of Systems:   A ROS was performed including pertinent positives and negatives as documented in the HPI.  Physical Exam :   Constitutional: NAD and appears stated age Neurological: Alert and oriented Psych: Appropriate affect and cooperative There were no vitals taken for this visit.   Comprehensive Musculoskeletal Exam:    Left thumb tenderness over the  A1 pulley.  No ratcheting or triggering.  Sensation is intact throughout.  Full composite fist   Imaging:   Xray (3 views left hand): Normal     I personally reviewed and interpreted the radiographs.   Assessment and Plan:   47 y.o. female with pain over the left thumb consistent with a trigger finger.  At today's visit I did recommend an initial ultrasound-guided injection to the A1 pulley.  Will plan to proceed with this.  I will plan to see her back to assess her progress in 1 month.    Procedure Note  Patient: Debra Hess             Date of Birth: Jun 18, 1975           MRN: [id number]             Visit Date: 02/28/2023  Procedures: Visit Diagnoses:  1. Pain in thumb joint with movement of left hand     Small Joint Inj: L  thumb MCP on 02/28/2023 9:54 AM         I personally saw and evaluated the patient, and participated in the management and treatment plan.  Huel Cote, MD Attending Physician, Orthopedic Surgery  This document was dictated using Dragon voice recognition software. A reasonable attempt at proof reading has been made to minimize errors.

## 2023-03-10 ENCOUNTER — Other Ambulatory Visit: Payer: Self-pay | Admitting: Medical Genetics

## 2023-03-10 DIAGNOSIS — Z006 Encounter for examination for normal comparison and control in clinical research program: Secondary | ICD-10-CM

## 2023-03-23 IMAGING — US US BREAST*R* LIMITED INC AXILLA
1 series · 5 of 5 positions shown · non-contrast
Comparison: None Available.
COMPARISON: Previous exams

*** End of Addendum ***
COMPARISON: None Available.

Addendum:
CLINICAL DATA: The patient was called back from screening
mammography for a right breast mass.

EXAM:
ULTRASOUND OF THE RIGHT BREAST

[Series 1: us breast*right* limited inc axilla · 0.07mm/px · 5 of 5 slices shown]
[im 1/5]
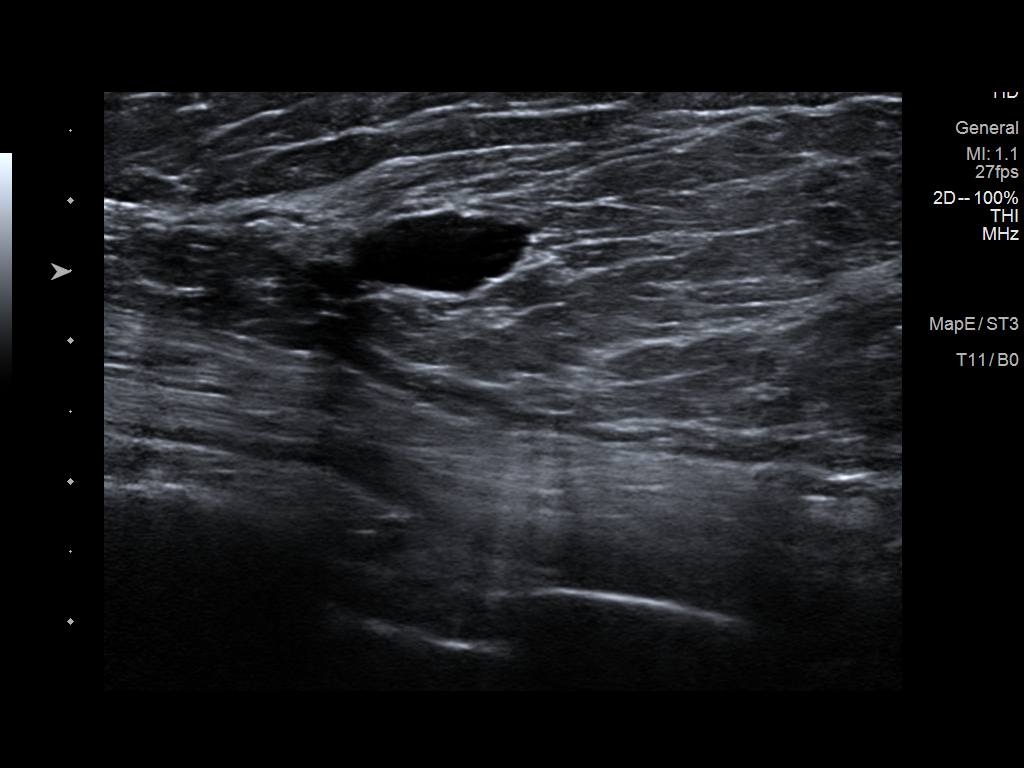
[im 2/5]
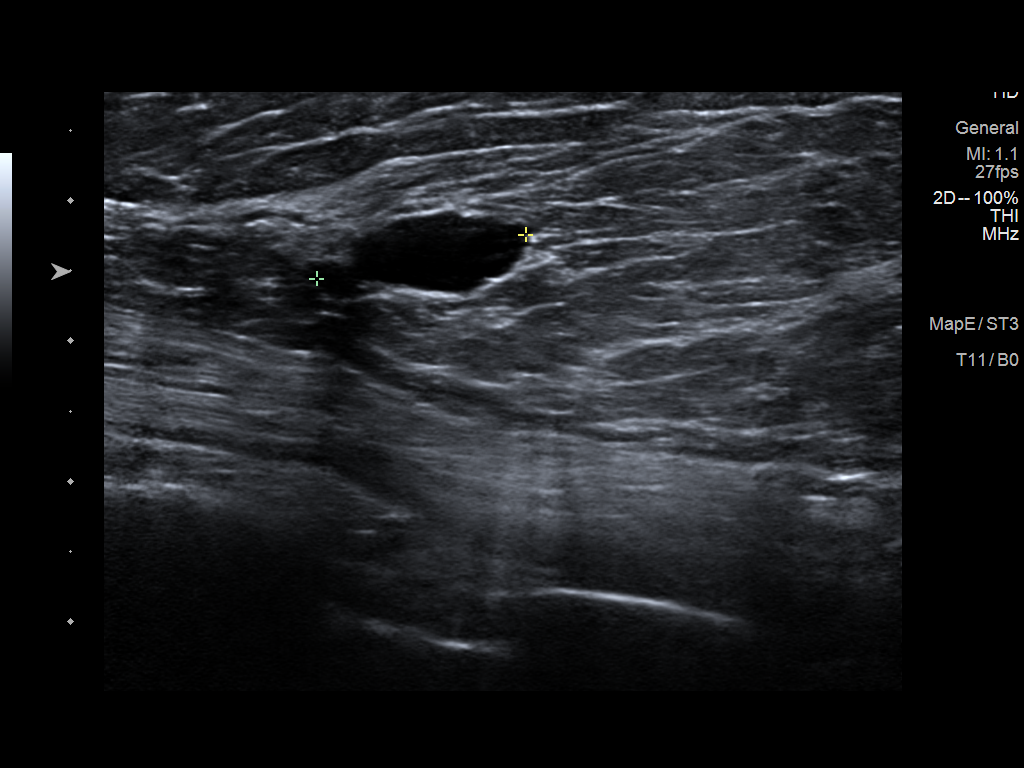
[im 3/5]
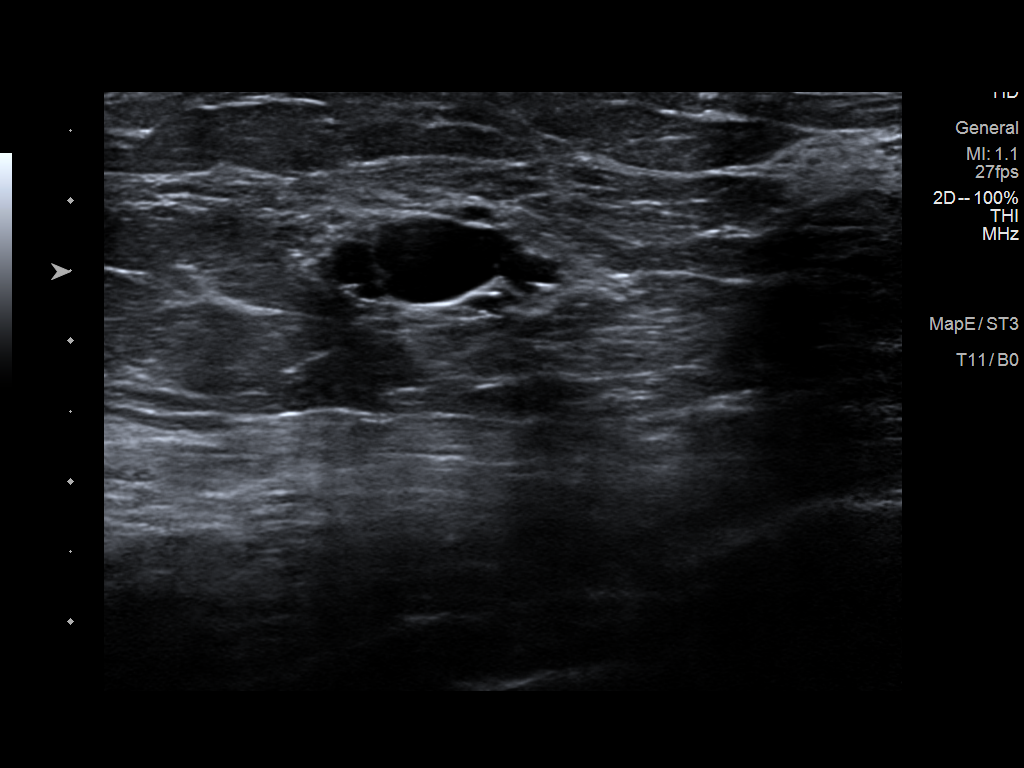
[im 4/5]
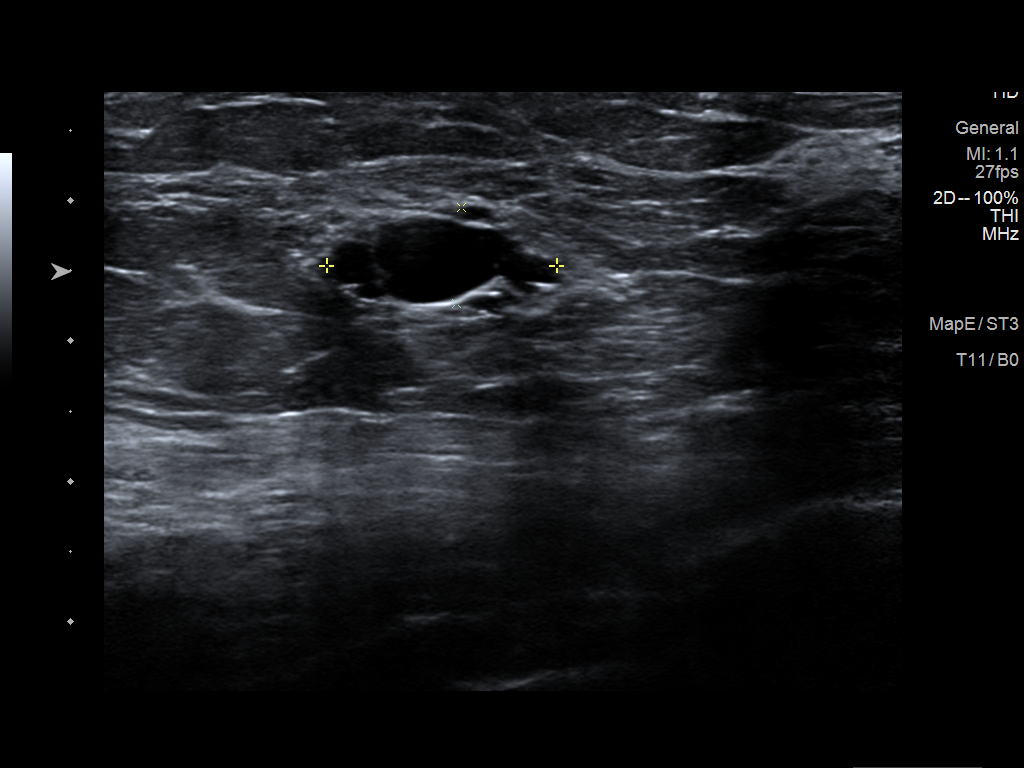
[im 5/5]
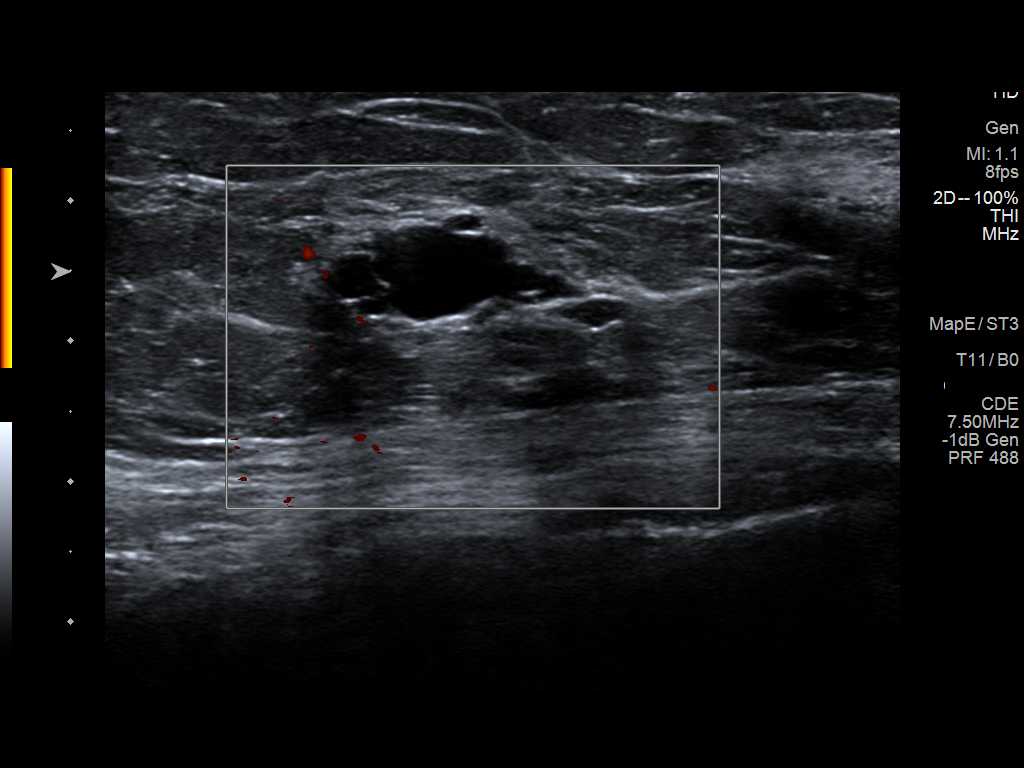

[5 of 5 positions shown; findings below may reference images not displayed]

FINDINGS: Targeted ultrasound is performed, showing a mildly complicated cyst
in the right breast at 10 o'clock accounting for the
mammographically identified mass.
IMPRESSION: Fibrocystic changes.  No evidence of malignancy.

RECOMMENDATION:
Annual screening mammography.

I have discussed the findings and recommendations with the patient.
If applicable, a reminder letter will be sent to the patient
regarding the next appointment.

BI-RADS CATEGORY  2: Benign.
FINDINGS: Targeted ultrasound is performed, showing a mildly complicated cyst
in the right breast at 10 o'clock accounting for the
mammographically identified mass.
IMPRESSION: Fibrocystic changes.  No evidence of malignancy.

RECOMMENDATION:
Annual screening mammography.

I have discussed the findings and recommendations with the patient.
If applicable, a reminder letter will be sent to the patient
regarding the next appointment.

BI-RADS CATEGORY  2: Benign.

## 2023-04-03 ENCOUNTER — Ambulatory Visit (HOSPITAL_BASED_OUTPATIENT_CLINIC_OR_DEPARTMENT_OTHER): Payer: Commercial Managed Care - PPO | Admitting: Orthopaedic Surgery

## 2023-04-03 DIAGNOSIS — M65312 Trigger thumb, left thumb: Secondary | ICD-10-CM | POA: Diagnosis not present

## 2023-04-03 NOTE — Progress Notes (Signed)
Chief Complaint: left thumb pain     History of Present Illness:   04/03/2023: Presents today for follow-up discussion of the left thumb.  Overall she is improving and the triggering has improved although there is still some soreness.  Debra Hess is a 47 y.o. female presents today with left thumb pain.  She is right-hand dominant.  This has been ongoing for the last several months and is worse with repetitive motion.  She has experienced pain about the dorsal aspect of the palm as well as the A1 pulley.  Gripping and opposition are difficult for her.  She works as a Engineer, civil (consulting) at [employer]    PMH/PSH/Family History/Social History/Meds/Allergies:    Past Medical History:  Diagnosis Date   Thyroid disease    Past Surgical History:  Procedure Laterality Date   CESAREAN SECTION     EAR TUBE REMOVAL     KNEE ARTHROPLASTY Left    Social History   Socioeconomic History   Marital status: Married    Spouse name: Not on file   Number of children: Not on file   Years of education: Not on file   Highest education level: Not on file  Occupational History   Not on file  Tobacco Use   Smoking status: Never   Smokeless tobacco: Never  Vaping Use   Vaping status: Never Used  Substance and Sexual Activity   Alcohol use: Yes   Drug use: Never   Sexual activity: Not on file  Other Topics Concern   Not on file  Social History Narrative   Not on file   Social Drivers of Health   Financial Resource Strain: Not on file  Food Insecurity: Not on file  Transportation Needs: Not on file  Physical Activity: Not on file  Stress: Not on file  Social Connections: Not on file   Family History  Problem Relation Age of Onset   Breast cancer Sister 4   Breast cancer Paternal Grandmother 23   Allergies  Allergen Reactions   Amoxapine And Related    Sulfa Antibiotics    Wellbutrin [Bupropion]    Penicillins Rash   Current Outpatient Medications  Medication Sig Dispense Refill    cyclobenzaprine (FLEXERIL) 10 MG tablet Take 1 tablet (10 mg total) by mouth 3 (three) times daily. 45 tablet 2   ergocalciferol (VITAMIN D2) 1.25 MG (50000 UT) capsule Take 1 capsule (50,000 Units total) by mouth every 7 (seven) days. Take 2000 units daily after finishing course 4 capsule 2   escitalopram (LEXAPRO) 20 MG tablet TAKE 1 TABLET BY MOUTH ONCE DAILY 30 tablet 5   levothyroxine (SYNTHROID) 88 MCG tablet Take 1 tablet (88 mcg total) by mouth daily. 90 tablet 3   levothyroxine (SYNTHROID) 88 MCG tablet Take 1 tablet (88 mcg total) by mouth daily. 90 tablet 3   ondansetron (ZOFRAN) 4 MG tablet Take 4-8 mg by mouth every 6 (six) hours as needed.     rOPINIRole (REQUIP) 0.25 MG tablet TAKE 1 TO 2 TABLETS BY MOUTH 1-3 HOURS BEFORE BEDTIME 90 tablet 1   triamcinolone cream (KENALOG) 0.1 % Apply 1 application topically 2 (two) times daily as needed. 240 g 3   No current facility-administered medications for this visit.   No results found.  Review of Systems:   A ROS was performed including pertinent positives and negatives as documented in the HPI.  Physical Exam :   Constitutional: NAD and appears stated age Neurological: Alert and oriented  Psych: Appropriate affect and cooperative There were no vitals taken for this visit.   Comprehensive Musculoskeletal Exam:    Left thumb tenderness over the A1 pulley.  No ratcheting or triggering.  Sensation is intact throughout.  Full composite fist   Imaging:   Xray (3 views left hand): Normal     I personally reviewed and interpreted the radiographs.   Assessment and Plan:   47 y.o. female with pain over the left thumb consistent with a trigger finger.  She is improving with her injection although this is not back to baseline.  We did briefly discuss trigger finger release.  She will defer this today.  She will reach out if she is interested in this in the future.     I personally saw and evaluated the patient, and  participated in the management and treatment plan.  Huel Cote, MD Attending Physician, Orthopedic Surgery  This document was dictated using Dragon voice recognition software. A reasonable attempt at proof reading has been made to minimize errors.

## 2023-04-06 ENCOUNTER — Emergency Department (HOSPITAL_COMMUNITY): Payer: Commercial Managed Care - PPO

## 2023-04-06 ENCOUNTER — Other Ambulatory Visit: Payer: Self-pay

## 2023-04-06 ENCOUNTER — Emergency Department (HOSPITAL_COMMUNITY)
Admission: EM | Admit: 2023-04-06 | Discharge: 2023-04-06 | Disposition: A | Discharge status: Home / Self Care | Payer: Commercial Managed Care - PPO | Attending: Emergency Medicine | Admitting: Emergency Medicine

## 2023-04-06 ENCOUNTER — Encounter (HOSPITAL_COMMUNITY): Payer: Self-pay

## 2023-04-06 DIAGNOSIS — H9201 Otalgia, right ear: Secondary | ICD-10-CM | POA: Diagnosis not present

## 2023-04-06 DIAGNOSIS — S82892A Other fracture of left lower leg, initial encounter for closed fracture: Secondary | ICD-10-CM

## 2023-04-06 DIAGNOSIS — X509XXA Other and unspecified overexertion or strenuous movements or postures, initial encounter: Secondary | ICD-10-CM | POA: Insufficient documentation

## 2023-04-06 DIAGNOSIS — S8262XA Displaced fracture of lateral malleolus of left fibula, initial encounter for closed fracture: Secondary | ICD-10-CM | POA: Diagnosis not present

## 2023-04-06 DIAGNOSIS — S99912A Unspecified injury of left ankle, initial encounter: Secondary | ICD-10-CM | POA: Diagnosis present

## 2023-04-06 DIAGNOSIS — S92155A Nondisplaced avulsion fracture (chip fracture) of left talus, initial encounter for closed fracture: Secondary | ICD-10-CM | POA: Diagnosis not present

## 2023-04-06 DIAGNOSIS — M25572 Pain in left ankle and joints of left foot: Secondary | ICD-10-CM | POA: Diagnosis not present

## 2023-04-06 MED ORDER — IBUPROFEN 800 MG PO TABS
800.0000 mg | ORAL_TABLET | Freq: Once | ORAL | Status: AC
  Administered 2023-04-06: 800 mg via ORAL

## 2023-04-06 MED ORDER — AZITHROMYCIN 250 MG PO TABS: 250.0000 mg | ORAL_TABLET | Freq: Every day | ORAL | 0 refills | Status: DC

## 2023-04-06 MED ORDER — HYDROMORPHONE HCL 1 MG/ML IJ SOLN
1.0000 mg | Freq: Once | INTRAMUSCULAR | Status: DC
Start: 2023-04-06 — End: 2023-04-06

## 2023-04-06 MED ORDER — ONDANSETRON HCL 4 MG/2ML IJ SOLN: 4.0000 mg | Freq: Once | INTRAMUSCULAR | Status: DC

## 2023-04-06 MED ORDER — IBUPROFEN 800 MG PO TABS: 800.0000 mg | ORAL_TABLET | Freq: Three times a day (TID) | ORAL | 0 refills | Status: AC

## 2023-04-06 NOTE — ED Notes (Signed)
IV access established and rainbow sent to lab PA at bedside gave verbal order for 4mg  zofran and 1mg  dilaudid.   Pt does not want any other medications at this time Leg elevated on pillow Strong pedal pulse Ice pack placed

## 2023-04-06 NOTE — ED Notes (Addendum)
Pts left ankle propped up on pillow and ice applied. Xray completed.

## 2023-04-06 NOTE — ED Provider Notes (Signed)
[place] [facility] DEPARTMENT AT [facility] Provider Note   CSN: [id number] Arrival date & time: 04/06/23  1122     History  Chief Complaint  Patient presents with   Ankle Pain    Debra Hess is a 47 y.o. female.   Ankle Pain Associated symptoms: no back pain, no fever and no neck pain         Debra Hess is a 47 y.o. female who presents to the Emergency Department complaining of left ankle pain and swelling.  States that she slipped on ice this morning, felt a pop in her ankle and that her ankle "rolled back."  She notes immediate swelling of her ankle.  Denies any head injury or LOC.  She landed on her buttocks, denies any pain of her mid to lower back or coccyx area.  Has bared weight to the ankle since the fall.  She does not take blood thinners.  She also complains of right ear pain.  Symptoms present for 1 week.  Describes feeling a fullness sensation of her ear.  Has history of recurrent ear infections to same ear.  Denies fever, dizziness and decreased hearing.  Home Medications Prior to Admission medications   Medication Sig Start Date End Date Taking? Authorizing Provider  cyclobenzaprine (FLEXERIL) 10 MG tablet Take 1 tablet (10 mg total) by mouth 3 (three) times daily. 05/15/22   Assunta Found, MD  ergocalciferol (VITAMIN D2) 1.25 MG (50000 UT) capsule Take 1 capsule (50,000 Units total) by mouth every 7 (seven) days. Take 2000 units daily after finishing course 10/15/22   Assunta Found, MD  escitalopram (LEXAPRO) 20 MG tablet TAKE 1 TABLET BY MOUTH ONCE DAILY 03/02/20 03/02/21  Assunta Found, MD  levothyroxine (SYNTHROID) 88 MCG tablet Take 1 tablet (88 mcg total) by mouth daily. 01/15/22     levothyroxine (SYNTHROID) 88 MCG tablet Take 1 tablet (88 mcg total) by mouth daily. 10/11/22   Assunta Found, MD  ondansetron (ZOFRAN) 4 MG tablet Take 4-8 mg by mouth every 6 (six) hours as needed. 07/08/19   [provider]  rOPINIRole (REQUIP) 0.25 MG  tablet TAKE 1 TO 2 TABLETS BY MOUTH 1-3 HOURS BEFORE BEDTIME 03/02/20 03/02/21  Assunta Found, MD  triamcinolone cream (KENALOG) 0.1 % Apply 1 application topically 2 (two) times daily as needed. 11/26/19   Glyn Ade, PA-C      Allergies    Amoxapine and related, Sulfa antibiotics, Wellbutrin [bupropion], and Penicillins    Review of Systems   Review of Systems  Constitutional:  Negative for chills and fever.  HENT:  Positive for ear pain. Negative for facial swelling and hearing loss.   Respiratory:  Negative for cough and shortness of breath.   Cardiovascular:  Negative for chest pain.  Gastrointestinal:  Negative for abdominal pain, nausea and vomiting.  Musculoskeletal:  Positive for arthralgias (Left ankle pain and swelling). Negative for back pain and neck pain.  Skin:  Negative for color change, rash and wound.  Neurological:  Negative for dizziness, weakness and headaches.    Physical Exam Updated Vital Signs BP (!) 148/78   Pulse 90   Temp 98 F (36.7 C) (Oral)   Resp 16   Ht 5\' 1"  (1.549 m)   Wt 83.9 kg   SpO2 100%   BMI 34.96 kg/m  Physical Exam Vitals and nursing note reviewed.  Constitutional:      General: She is not in acute distress.    Appearance: Normal  appearance. She is not ill-appearing or toxic-appearing.  HENT:     Head: Atraumatic.     Right Ear: Ear canal normal. No middle ear effusion. There is no impacted cerumen. No mastoid tenderness. No hemotympanum. Tympanic membrane is bulging. Tympanic membrane is not perforated or erythematous.     Left Ear: Tympanic membrane and ear canal normal.     Ears:     Comments: Mild to moderate bulging of the right TM.  No erythema, no perforation, no drainage to the ear canal. Cardiovascular:     Rate and Rhythm: Normal rate and regular rhythm.     Pulses: Normal pulses.     Heart sounds: No murmur heard. Pulmonary:     Effort: Pulmonary effort is normal.  Musculoskeletal:        General: Swelling,  tenderness and signs of injury present.     Cervical back: Normal range of motion. No tenderness.     Right lower leg: No edema.     Left lower leg: No edema.     Left ankle: Swelling present. Tenderness present over the lateral malleolus. Decreased range of motion.     Left Achilles Tendon: Normal. No tenderness or defects.     Comments: Tenderness palpation lateral aspect of the left ankle.  Significant soft tissue swelling of the lateral malleolus.  No bony deformity noted.  Achilles tendon nontender and appears intact.  Compartments of the extremity are soft.  No tenderness proximal to the ankle  Skin:    General: Skin is warm.     Capillary Refill: Capillary refill takes less than 2 seconds.  Neurological:     General: No focal deficit present.     Mental Status: She is alert.     Sensory: No sensory deficit.     Motor: No weakness.     ED Results / Procedures / Treatments   Labs (all labs ordered are listed, but only abnormal results are displayed) Labs Reviewed - No data to display  EKG None  Radiology DG Ankle Complete Left Result Date: 04/06/2023 CLINICAL DATA:  Fall.  Pain.  Swelling. EXAM: LEFT ANKLE COMPLETE - 3+ VIEW COMPARISON:  None Available. FINDINGS: No gross fracture or dislocation. There is a tiny cortical avulsion fragment adjacent to the inferior aspect of the lateral malleolus. Soft tissue swelling is most prominent laterally. Tibiotalar joint is intact. IMPRESSION: 1. Tiny cortical avulsion fragment adjacent to the inferior aspect of the lateral malleolus. 2. Lateral soft tissue swelling. Electronically Signed   By: Kennith Center M.D.   On: 04/06/2023 11:53    Procedures Procedures    Medications Ordered in ED Medications  ibuprofen (ADVIL) tablet 800 mg (800 mg Oral Given 04/06/23 1128)    ED Course/ Medical Decision Making/ A&P                                 Medical Decision Making Patient here from home after mechanical fall with injury of the  left ankle.  Describes a slip and fall on ice, felt a pop in her ankle and her ankle "rolled back."  She endorses immediate swelling pain with movement and weightbearing.  Denies any head injury LOC neck pain or pain of her back.  Does not take blood thinners.  She also has some ongoing discomfort of her right ear x 1 week.  History of recurrent ear infections.  TM is bulging on exam without erythema  Differential diagnosis of the ankle would include but not limited to fracture, dislocation, sprain.  Compartments are soft  Amount and/or Complexity of Data Reviewed Radiology: ordered.    Details: X-ray of the left ankle shows a tiny cortical avulsion fracture to the inferior aspect of the lateral malleolus Discussion of management or test interpretation with external provider(s): Discussed x-ray findings with the patient, she is agreeable to RICE therapy.  Cam boot applied.  Ibuprofen for pain, she will follow-up with her preferred orthopedic in 1 week if needed.  Risk Prescription drug management.           Final Clinical Impression(s) / ED Diagnoses Final diagnoses:  Closed avulsion fracture of left ankle, initial encounter  Otalgia, right ear    Rx / DC Orders ED Discharge Orders     None         Rosey Bath 04/06/23 1233    Gerhard Munch, MD 04/07/23 [phone]

## 2023-04-06 NOTE — ED Triage Notes (Signed)
Slipped on ice Gave 800mg  IBU in triage  Pt stated she fell backwards onto buttock and back.  Denies hitting head

## 2023-04-06 NOTE — ED Notes (Signed)
CAM boot applied, paperwork filled out. Iv removed from left AC.

## 2023-04-06 NOTE — Discharge Instructions (Signed)
Your x-ray today shows a small avulsion fracture of your ankle.  I recommend you wear the cam boot for walking and standing.  Ibuprofen as directed for pain.  Please follow-up with your orthopedic provider in 1 week for recheck.

## 2023-04-15 ENCOUNTER — Ambulatory Visit (INDEPENDENT_AMBULATORY_CARE_PROVIDER_SITE_OTHER): Payer: Commercial Managed Care - PPO | Admitting: Student

## 2023-04-15 ENCOUNTER — Encounter (HOSPITAL_BASED_OUTPATIENT_CLINIC_OR_DEPARTMENT_OTHER): Payer: Self-pay | Admitting: Student

## 2023-04-15 DIAGNOSIS — S82892A Other fracture of left lower leg, initial encounter for closed fracture: Secondary | ICD-10-CM

## 2023-04-15 DIAGNOSIS — S82832A Other fracture of upper and lower end of left fibula, initial encounter for closed fracture: Secondary | ICD-10-CM

## 2023-04-15 NOTE — Progress Notes (Signed)
Chief Complaint: Left ankle fracture     History of Present Illness:    Debra Hess is a 47 y.o. female presenting today for follow-up of a left ankle fracture.  On 12/21 she was seen in the emergency department after slipping on ice and feeling a pop in her ankle.  States that the ankle was forced into deep plantarflexion.  She was placed in a walking boot and has been using a knee scooter as x-rays in the ED revealed a small distal fibula avulsion.  Rates pain today at a 3/10.  She has been icing and elevating.  Does note some occasional numbness and tingling in the toes as Debra Hess.   Surgical History:   None  PMH/PSH/Family History/Social History/Meds/Allergies:    Past Medical History:  Diagnosis Date   Thyroid disease    Past Surgical History:  Procedure Laterality Date   CESAREAN SECTION     EAR TUBE REMOVAL     KNEE ARTHROPLASTY Left    Social History   Socioeconomic History   Marital status: Married    Spouse name: Not on file   Number of children: Not on file   Years of education: Not on file   Highest education level: Not on file  Occupational History   Not on file  Tobacco Use   Smoking status: Never   Smokeless tobacco: Never  Vaping Use   Vaping status: Never Used  Substance and Sexual Activity   Alcohol use: Yes   Drug use: Never   Sexual activity: Not on file  Other Topics Concern   Not on file  Social History Narrative   Not on file   Social Drivers of Health   Financial Resource Strain: Not on file  Food Insecurity: Not on file  Transportation Needs: Not on file  Physical Activity: Not on file  Stress: Not on file  Social Connections: Not on file   Family History  Problem Relation Age of Onset   Breast cancer Sister 67   Breast cancer Paternal Grandmother 87   Allergies  Allergen Reactions   Amoxapine And Related    Sulfa Antibiotics    Wellbutrin [Bupropion]    Penicillins Rash   Current  Outpatient Medications  Medication Sig Dispense Refill   azithromycin (ZITHROMAX) 250 MG tablet Take 1 tablet (250 mg total) by mouth daily. Take first 2 tablets together, then 1 every day until finished. 6 tablet 0   cyclobenzaprine (FLEXERIL) 10 MG tablet Take 1 tablet (10 mg total) by mouth 3 (three) times daily. 45 tablet 2   ergocalciferol (VITAMIN D2) 1.25 MG (50000 UT) capsule Take 1 capsule (50,000 Units total) by mouth every 7 (seven) days. Take 2000 units daily after finishing course 4 capsule 2   escitalopram (LEXAPRO) 20 MG tablet TAKE 1 TABLET BY MOUTH ONCE DAILY 30 tablet 5   ibuprofen (ADVIL) 800 MG tablet Take 1 tablet (800 mg total) by mouth 3 (three) times daily. Take with food 21 tablet 0   levothyroxine (SYNTHROID) 88 MCG tablet Take 1 tablet (88 mcg total) by mouth daily. 90 tablet 3   levothyroxine (SYNTHROID) 88 MCG tablet Take 1 tablet (88 mcg total) by mouth daily. 90 tablet 3   ondansetron (ZOFRAN) 4 MG tablet Take 4-8 mg by mouth every 6 (six) hours  as needed.     rOPINIRole (REQUIP) 0.25 MG tablet TAKE 1 TO 2 TABLETS BY MOUTH 1-3 HOURS BEFORE BEDTIME 90 tablet 1   triamcinolone cream (KENALOG) 0.1 % Apply 1 application topically 2 (two) times daily as needed. 240 g 3   No current facility-administered medications for this visit.   No results found.  Review of Systems:   A ROS was performed including pertinent positives and negatives as documented in the HPI.  Physical Exam :   Constitutional: NAD and appears stated age Neurological: Alert and oriented Psych: Appropriate affect and cooperative There were no vitals taken for this visit.   Comprehensive Musculoskeletal Exam:    Left ankle exam demonstrates mild to moderate edema with ecchymosis extending into the lateral midfoot and fifth metatarsal distribution.  There is small focal ecchymosis noted over the fifth metatarsal base.  Tenderness to palpation over the distal fibula, lateral ankle ligaments, and  fifth metatarsal base.  AROM limited due to pain and swelling.  Dorsalis pedis 2+.  Imaging:   Xray review from 04/06/23 (left ankle 3 views): Small avulsion of the distal fibular tip.  Lateral ankle soft tissue edema.  No other evidence of acute abnormality.   I personally reviewed and interpreted the radiographs.   Assessment:   47 y.o. female with an acute avulsion off the left distal fibula after an injury 9 days ago.  Her mechanism was described as an inversion/plantarflexion injury and given the point tenderness and ecchymosis around the fifth metatarsal base, I do also have some concern for an injury to this area.  Ankle x-rays do not appear to show a fifth metatarsal fracture but I will plan on obtaining dedicated foot x-rays at next follow-up.  I will recommend that she remain in the walking boot and can slowly begin progressing weightbearing as tolerated.  After discussion concerning her job, we did ultimately decide to hold her out for at least the next week as she is typically required to walk and stand throughout.  Will continue to monitor symptoms and she may be able to progress back to work with some restrictions by next week if pain and swelling have improved.  I will plan to see her back in approximately 4 weeks.  Plan :    -Return to clinic in 4 weeks for reassessment and repeat x-ray of the left ankle and foot     I personally saw and evaluated the patient, and participated in the management and treatment plan.  [name], PA-C Orthopedics

## 2023-04-23 ENCOUNTER — Encounter (HOSPITAL_BASED_OUTPATIENT_CLINIC_OR_DEPARTMENT_OTHER): Payer: Self-pay

## 2023-05-13 ENCOUNTER — Ambulatory Visit (HOSPITAL_BASED_OUTPATIENT_CLINIC_OR_DEPARTMENT_OTHER): Payer: Commercial Managed Care - PPO | Admitting: Student

## 2023-05-13 ENCOUNTER — Ambulatory Visit (HOSPITAL_BASED_OUTPATIENT_CLINIC_OR_DEPARTMENT_OTHER): Payer: Commercial Managed Care - PPO

## 2023-05-13 DIAGNOSIS — M79672 Pain in left foot: Secondary | ICD-10-CM | POA: Diagnosis not present

## 2023-05-13 DIAGNOSIS — S82832A Other fracture of upper and lower end of left fibula, initial encounter for closed fracture: Secondary | ICD-10-CM

## 2023-05-13 DIAGNOSIS — S82892A Other fracture of left lower leg, initial encounter for closed fracture: Secondary | ICD-10-CM

## 2023-05-13 DIAGNOSIS — S9032XA Contusion of left foot, initial encounter: Secondary | ICD-10-CM | POA: Diagnosis not present

## 2023-05-13 DIAGNOSIS — S82402D Unspecified fracture of shaft of left fibula, subsequent encounter for closed fracture with routine healing: Secondary | ICD-10-CM | POA: Diagnosis not present

## 2023-05-13 DIAGNOSIS — S8262XD Displaced fracture of lateral malleolus of left fibula, subsequent encounter for closed fracture with routine healing: Secondary | ICD-10-CM | POA: Diagnosis not present

## 2023-05-13 NOTE — Progress Notes (Unsigned)
Chief Complaint: Left ankle fracture     History of Present Illness:   05/13/23: Patient is here today for follow-up of a left ankle fracture.  Overall she reports doing better however pain has been mildly increased over the last week.  She has been using the walking boot which she has tried to begin weaning out of at home.  Pain is a still located mainly over the lateral ankle and foot.  Takes ibuprofen as needed for pain.  She has been back to work some recently.   Debra Hess is a 48 y.o. female presenting today for follow-up of a left ankle fracture.  On 12/21 she was seen in the emergency department after slipping on ice and feeling a pop in her ankle.  States that the ankle was forced into deep plantarflexion.  She was placed in a walking boot and has been using a knee scooter as x-rays in the ED revealed a small distal fibula avulsion.  Rates pain today at a 3/10.  She has been icing and elevating.  Does note some occasional numbness and tingling in the toes.  Works as a Engineer, civil (consulting) at [employer].   Surgical History:   None  PMH/PSH/Family History/Social History/Meds/Allergies:    Past Medical History:  Diagnosis Date   Thyroid disease    Past Surgical History:  Procedure Laterality Date   CESAREAN SECTION     EAR TUBE REMOVAL     KNEE ARTHROPLASTY Left    Social History   Socioeconomic History   Marital status: Married    Spouse name: Not on file   Number of children: Not on file   Years of education: Not on file   Highest education level: Not on file  Occupational History   Not on file  Tobacco Use   Smoking status: Never   Smokeless tobacco: Never  Vaping Use   Vaping status: Never Used  Substance and Sexual Activity   Alcohol use: Yes   Drug use: Never   Sexual activity: Not on file  Other Topics Concern   Not on file  Social History Narrative   Not on file   Social Drivers of Health   Financial Resource Strain: Not on  file  Food Insecurity: Not on file  Transportation Needs: Not on file  Physical Activity: Not on file  Stress: Not on file  Social Connections: Not on file   Family History  Problem Relation Age of Onset   Breast cancer Sister 61   Breast cancer Paternal Grandmother 78   Allergies  Allergen Reactions   Amoxapine And Related    Sulfa Antibiotics    Wellbutrin [Bupropion]    Penicillins Rash   Current Outpatient Medications  Medication Sig Dispense Refill   azithromycin (ZITHROMAX) 250 MG tablet Take 1 tablet (250 mg total) by mouth daily. Take first 2 tablets together, then 1 every day until finished. 6 tablet 0   cyclobenzaprine (FLEXERIL) 10 MG tablet Take 1 tablet (10 mg total) by mouth 3 (three) times daily. 45 tablet 2   ergocalciferol (VITAMIN D2) 1.25 MG (50000 UT) capsule Take 1 capsule (50,000 Units total) by mouth every 7 (seven) days. Take 2000 units daily after finishing course 4 capsule 2   escitalopram (LEXAPRO) 20 MG tablet TAKE 1 TABLET BY MOUTH ONCE DAILY  30 tablet 5   ibuprofen (ADVIL) 800 MG tablet Take 1 tablet (800 mg total) by mouth 3 (three) times daily. Take with food 21 tablet 0   levothyroxine (SYNTHROID) 88 MCG tablet Take 1 tablet (88 mcg total) by mouth daily. 90 tablet 3   levothyroxine (SYNTHROID) 88 MCG tablet Take 1 tablet (88 mcg total) by mouth daily. 90 tablet 3   ondansetron (ZOFRAN) 4 MG tablet Take 4-8 mg by mouth every 6 (six) hours as needed.     rOPINIRole (REQUIP) 0.25 MG tablet TAKE 1 TO 2 TABLETS BY MOUTH 1-3 HOURS BEFORE BEDTIME 90 tablet 1   triamcinolone cream (KENALOG) 0.1 % Apply 1 application topically 2 (two) times daily as needed. 240 g 3   No current facility-administered medications for this visit.   No results found.  Review of Systems:   A ROS was performed including pertinent positives and negatives as documented in the HPI.  Physical Exam :   Constitutional: NAD and appears stated age Neurological: Alert and  oriented Psych: Appropriate affect and cooperative There were no vitals taken for this visit.   Comprehensive Musculoskeletal Exam:    Left ankle exam demonstrates mild swelling without any focal ecchymosis.  Active ankle range of motion to 20 degrees dorsiflexion and plantarflexion.  Mild tenderness over the lateral ankle ligaments and fifth metatarsal base.  Negative anterior drawer.  Small focal area of ecchymosis noted over the fourth proximal phalanx.  Dorsalis pedis pulse 2+.  Neurosensory exam intact.  Imaging:   Xray (left ankle 3 views, left foot 3 views): Small avulsion noted off the lateral talar process.  No further evidence of acute bony abnormality within the ankle or foot.   I personally reviewed and interpreted the radiographs.  Assessment:   48 y.o. female 4 weeks status post avulsion fracture to the left ankle.  This does appear to involve the lateral talar process.  Discussed that we will continue to treat similarly to an ankle sprain and therefore I would recommend continuing in the boot for comfort as needed however she can begin to wean out if she can tolerate.  I did evaluate the foot as well today as there was some concern for additional injury given focal tenderness and ecchymosis over the fifth metatarsal base.  There does not appear to be any evidence of fracture in this area.  Will plan to see her back in another 4 weeks to assess progress and overall function.  Plan :    -Continue walking boot as needed and return to clinic in 4 to 6 weeks for follow-up     I personally saw and evaluated the patient, and participated in the management and treatment plan.  [name], PA-C Orthopedics

## 2023-05-30 ENCOUNTER — Other Ambulatory Visit: Payer: Self-pay

## 2023-09-30 ENCOUNTER — Telehealth: Admitting: Physician Assistant

## 2023-09-30 DIAGNOSIS — S39012A Strain of muscle, fascia and tendon of lower back, initial encounter: Secondary | ICD-10-CM

## 2023-09-30 MED ORDER — METHYLPREDNISOLONE 4 MG PO TBPK: ORAL_TABLET | ORAL | 0 refills | Status: DC

## 2023-09-30 NOTE — Progress Notes (Signed)
 E-Visit for Back Pain   We are sorry that you are not feeling well.  Here is how we plan to help!  Based on what you have shared with me it looks like you mostly have acute back pain.  Acute back pain is defined as musculoskeletal pain that can resolve in 1-3 weeks with conservative treatment.  I have prescribed Medrol dose pack, a 6 day taper of a steroid anti-inflammatory.  May continue your Flexeril  at home. Some patients experience stomach irritation or in increased heartburn with anti-inflammatory drugs.  Please keep in mind that muscle relaxer's can cause fatigue and should not be taken while at work or driving.  Back pain is very common.  The pain often gets better over time.  The cause of back pain is usually not dangerous.  Most people can learn to manage their back pain on their own.  Home Care Stay active.  Start with short walks on flat ground if you can.  Try to walk farther each day. Do not sit, drive or stand in one place for more than 30 minutes.  Do not stay in bed. Do not avoid exercise or work.  Activity can help your back heal faster. Be careful when you bend or lift an object.  Bend at your knees, keep the object close to you, and do not twist. Sleep on a firm mattress.  Lie on your side, and bend your knees.  If you lie on your back, put a pillow under your knees. Only take medicines as told by your doctor. Put ice on the injured area. Put ice in a plastic bag Place a towel between your skin and the bag Leave the ice on for 15-20 minutes, 3-4 times a day for the first 2-3 days. 210 After that, you can switch between ice and heat packs. Ask your doctor about back exercises or massage. Avoid feeling anxious or stressed.  Find good ways to deal with stress, such as exercise.  Get Help Right Way If: Your pain does not go away with rest or medicine. Your pain does not go away in 1 week. You have new problems. You do not feel well. The pain spreads into your legs. You  cannot control when you poop (bowel movement) or pee (urinate) You feel sick to your stomach (nauseous) or throw up (vomit) You have belly (abdominal) pain. You feel like you may pass out (faint). If you develop a fever.  Make Sure you: Understand these instructions. Will watch your condition Will get help right away if you are not doing well or get worse.  Your e-visit answers were reviewed by a board certified advanced clinical practitioner to complete your personal care plan.  Depending on the condition, your plan could have included both over the counter or prescription medications.  If there is a problem please reply  once you have received a response from your provider.  Your safety is important to us .  If you have drug allergies check your prescription carefully.    You can use MyChart to ask questions about today's visit, request a non-urgent call back, or ask for a work or school excuse for 24 hours related to this e-Visit. If it has been greater than 24 hours you will need to follow up with your provider, or enter a new e-Visit to address those concerns.  You will get an e-mail in the next two days asking about your experience.  I hope that your e-visit has been valuable and  will speed your recovery. Thank you for using e-visits.    I have spent 5 minutes in review of e-visit questionnaire, review and updating patient chart, medical decision making and response to patient.   Angelia Kelp, PA-C

## 2023-11-08 DIAGNOSIS — Z0000 Encounter for general adult medical examination without abnormal findings: Secondary | ICD-10-CM | POA: Diagnosis not present

## 2023-11-08 DIAGNOSIS — E7849 Other hyperlipidemia: Secondary | ICD-10-CM | POA: Diagnosis not present

## 2023-11-08 DIAGNOSIS — E559 Vitamin D deficiency, unspecified: Secondary | ICD-10-CM | POA: Diagnosis not present

## 2023-11-08 DIAGNOSIS — E039 Hypothyroidism, unspecified: Secondary | ICD-10-CM | POA: Diagnosis not present

## 2023-11-08 DIAGNOSIS — D509 Iron deficiency anemia, unspecified: Secondary | ICD-10-CM | POA: Diagnosis not present

## 2024-02-05 ENCOUNTER — Other Ambulatory Visit: Payer: Self-pay | Admitting: Medical Genetics

## 2024-02-05 DIAGNOSIS — Z006 Encounter for examination for normal comparison and control in clinical research program: Secondary | ICD-10-CM

## 2024-02-11 ENCOUNTER — Ambulatory Visit (INDEPENDENT_AMBULATORY_CARE_PROVIDER_SITE_OTHER)

## 2024-02-11 ENCOUNTER — Ambulatory Visit (HOSPITAL_BASED_OUTPATIENT_CLINIC_OR_DEPARTMENT_OTHER): Admitting: Student

## 2024-02-11 ENCOUNTER — Encounter (HOSPITAL_BASED_OUTPATIENT_CLINIC_OR_DEPARTMENT_OTHER): Payer: Self-pay | Admitting: Student

## 2024-02-11 ENCOUNTER — Other Ambulatory Visit (HOSPITAL_BASED_OUTPATIENT_CLINIC_OR_DEPARTMENT_OTHER): Payer: Self-pay

## 2024-02-11 DIAGNOSIS — M25511 Pain in right shoulder: Secondary | ICD-10-CM

## 2024-02-11 DIAGNOSIS — M4312 Spondylolisthesis, cervical region: Secondary | ICD-10-CM | POA: Diagnosis not present

## 2024-02-11 DIAGNOSIS — M542 Cervicalgia: Secondary | ICD-10-CM

## 2024-02-11 DIAGNOSIS — M47812 Spondylosis without myelopathy or radiculopathy, cervical region: Secondary | ICD-10-CM | POA: Diagnosis not present

## 2024-02-11 MED ORDER — METHYLPREDNISOLONE 4 MG PO TBPK
ORAL_TABLET | ORAL | 0 refills | Status: DC
  Filled 2024-02-11: qty 21, 6d supply, fill #0

## 2024-02-11 NOTE — Progress Notes (Signed)
 Chief Complaint: Right arm pain     History of Present Illness:    Debra Hess is a 48 y.o. female who presents today for evaluation of pain mainly in her right arm.  This has been ongoing for the last 3 weeks without any known injury and has worsened over the last week.  She does experience some right sided discomfort at the base of the neck and states that pain does not travel down past the elbow.  She does have some occasional tingling in the fingers but this is not consistent.  Pain is generally worse at night.  She feels a decrease in strength of the right arm but does retain full range of motion although this is painful.  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  She did have a similar flareup in 2019 and was given a prednisone pack for suspected pinched nerve in the neck which completely resolved her symptoms.   Surgical History:   None  PMH/PSH/Family History/Social History/Meds/Allergies:    Past Medical History:  Diagnosis Date   Thyroid  disease    Past Surgical History:  Procedure Laterality Date   CESAREAN SECTION     EAR TUBE REMOVAL     KNEE ARTHROPLASTY Left    Social History   Socioeconomic History   Marital status: Divorced    Spouse name: Not on file   Number of children: Not on file   Years of education: Not on file   Highest education level: Not on file  Occupational History   Not on file  Tobacco Use   Smoking status: Never   Smokeless tobacco: Never  Vaping Use   Vaping status: Never Used  Substance and Sexual Activity   Alcohol use: Yes   Drug use: Never   Sexual activity: Not on file  Other Topics Concern   Not on file  Social History Narrative   Not on file   Social Drivers of Health   Financial Resource Strain: Not on file  Food Insecurity: Not on file  Transportation Needs: Not on file  Physical Activity: Not on file  Stress: Not on file  Social Connections: Not on  file   Family History  Problem Relation Age of Onset   Breast cancer Sister 55   Breast cancer Paternal Grandmother 76   Allergies  Allergen Reactions   Amoxapine And Related    Sulfa Antibiotics    Wellbutrin [Bupropion]    Penicillins Rash   Current Outpatient Medications  Medication Sig Dispense Refill   methylPREDNISolone  (MEDROL  DOSEPAK) 4 MG TBPK tablet Take per packet instructions 21 tablet 0   cyclobenzaprine  (FLEXERIL ) 10 MG tablet Take 1 tablet (10 mg total) by mouth 3 (three) times daily. 45 tablet 2   ibuprofen  (ADVIL ) 800 MG tablet Take 1 tablet (800 mg total) by mouth 3 (three) times daily. Take with food 21 tablet 0   levothyroxine  (SYNTHROID ) 88 MCG tablet Take 1 tablet (88 mcg total) by mouth daily. 90 tablet 3   ondansetron  (ZOFRAN ) 4 MG tablet Take 4-8 mg by mouth every 6 (six) hours as needed.     rOPINIRole (REQUIP) 0.25 MG tablet TAKE 1 TO 2 TABLETS BY MOUTH 1-3 HOURS BEFORE BEDTIME 90 tablet 1   No current facility-administered medications for  this visit.   No results found.  Review of Systems:   A ROS was performed including pertinent positives and negatives as documented in the HPI.  Physical Exam :   Constitutional: NAD and appears stated age Neurological: Alert and oriented Psych: Appropriate affect and cooperative There were no vitals taken for this visit.   Comprehensive Musculoskeletal Exam:    No midline tenderness of the cervical spine with mild tenderness in the right paraspinal musculature.  Cervical range of motion is painful and slightly limited with rotation to the right.  Negative Spurling's.  Full shoulder range of motion with flexion and external rotation although discomfort is noted.  Positive Neer and Hawkins.  Negative empty can.  Grip strength 5/5 bilaterally.  Imaging:   Xray (cervical spine 4 views): Disc space narrowing and anterior osteophyte formation most prominent at the C6-C7 level  Xray (right shoulder 3  views): Negative for bony abnormality   I personally reviewed and interpreted the radiographs.   Assessment:   48 y.o. female with 3-week history of right shoulder and arm pain.  She does have a history of a similar episode in 2019 that was suggestive of cervical radiculopathy and resolved with an oral steroid taper.  X-rays today do show some focal narrowing and osteophyte formation at C6-C7.  Discussed today that based on her presentation I am more suspicious of a cervical radiculopathy as the cause of her symptoms but unable to rule out underlying contribution from rotator cuff tendinopathy as well.  Given that she has gotten significant relief in the past from steroids, I will provide a Medrol  Dosepak for symptomatic relief.  Can consider physical therapy if symptoms do not fully resolve or recur.  If symptoms continue to present in the shoulder we also discussed trialing of a subacromial injection for further relief and diagnostic purposes.  Plan :    - Start Medrol  Dosepak and follow-up if symptoms persist or worsen     I personally saw and evaluated the patient, and participated in the management and treatment plan.  Leonce Reveal, PA-C Orthopedics

## 2024-02-17 ENCOUNTER — Encounter: Payer: Self-pay | Admitting: Radiology

## 2024-02-27 ENCOUNTER — Other Ambulatory Visit: Payer: Self-pay | Admitting: Family Medicine

## 2024-02-27 DIAGNOSIS — Z1231 Encounter for screening mammogram for malignant neoplasm of breast: Secondary | ICD-10-CM

## 2024-03-03 ENCOUNTER — Ambulatory Visit
Admission: RE | Admit: 2024-03-03 | Discharge: 2024-03-03 | Disposition: A | Source: Ambulatory Visit | Attending: Family Medicine | Admitting: Family Medicine

## 2024-03-03 DIAGNOSIS — Z1231 Encounter for screening mammogram for malignant neoplasm of breast: Secondary | ICD-10-CM

## 2024-03-09 LAB — GENECONNECT MOLECULAR SCREEN: Genetic Analysis Overall Interpretation: NEGATIVE

## 2024-03-19 ENCOUNTER — Other Ambulatory Visit (HOSPITAL_BASED_OUTPATIENT_CLINIC_OR_DEPARTMENT_OTHER): Payer: Self-pay | Admitting: Student

## 2024-03-19 ENCOUNTER — Encounter (HOSPITAL_BASED_OUTPATIENT_CLINIC_OR_DEPARTMENT_OTHER): Payer: Self-pay

## 2024-03-19 MED ORDER — METHYLPREDNISOLONE 4 MG PO TBPK: ORAL_TABLET | ORAL | 0 refills | Status: AC
